# Patient Record
Sex: Male | Born: 1960
Health system: Southern US, Community
[De-identification: ages and names within clinical notes are randomized; demographics above are authoritative.]

## PROBLEM LIST (undated history)

## (undated) DIAGNOSIS — K219 Gastro-esophageal reflux disease without esophagitis: Secondary | ICD-10-CM

## (undated) DIAGNOSIS — I499 Cardiac arrhythmia, unspecified: Secondary | ICD-10-CM

## (undated) DIAGNOSIS — Z0282 Encounter for adoption services: Secondary | ICD-10-CM

## (undated) DIAGNOSIS — I480 Paroxysmal atrial fibrillation: Secondary | ICD-10-CM

## (undated) DIAGNOSIS — F329 Major depressive disorder, single episode, unspecified: Secondary | ICD-10-CM

## (undated) DIAGNOSIS — I519 Heart disease, unspecified: Secondary | ICD-10-CM

## (undated) DIAGNOSIS — F32A Depression, unspecified: Secondary | ICD-10-CM

## (undated) DIAGNOSIS — F419 Anxiety disorder, unspecified: Secondary | ICD-10-CM

---

## 2006-01-06 ENCOUNTER — Encounter: Admission: RE | Admit: 2006-01-06 | Discharge: 2006-01-06 | Payer: Self-pay | Admitting: Otolaryngology

## 2012-10-23 DIAGNOSIS — I499 Cardiac arrhythmia, unspecified: Secondary | ICD-10-CM

## 2012-10-23 HISTORY — DX: Cardiac arrhythmia, unspecified: I49.9

## 2012-11-01 ENCOUNTER — Encounter (HOSPITAL_COMMUNITY): Payer: Self-pay | Admitting: Unknown Physician Specialty

## 2012-11-01 ENCOUNTER — Ambulatory Visit (INDEPENDENT_AMBULATORY_CARE_PROVIDER_SITE_OTHER): Payer: PRIVATE HEALTH INSURANCE | Admitting: Family Medicine

## 2012-11-01 ENCOUNTER — Inpatient Hospital Stay (HOSPITAL_COMMUNITY)
Admission: EM | Admit: 2012-11-01 | Discharge: 2012-11-05 | DRG: 308 | Disposition: A | Discharge status: Home / Self Care | Payer: No Typology Code available for payment source | Attending: Cardiology | Admitting: Cardiology

## 2012-11-01 ENCOUNTER — Emergency Department (HOSPITAL_COMMUNITY): Payer: No Typology Code available for payment source

## 2012-11-01 VITALS — BP 133/96 | HR 170 | Temp 98.2°F | Resp 20 | Ht 72.0 in | Wt 184.0 lb

## 2012-11-01 DIAGNOSIS — Q211 Atrial septal defect: Secondary | ICD-10-CM

## 2012-11-01 DIAGNOSIS — I509 Heart failure, unspecified: Secondary | ICD-10-CM | POA: Diagnosis not present

## 2012-11-01 DIAGNOSIS — I4891 Unspecified atrial fibrillation: Principal | ICD-10-CM | POA: Diagnosis present

## 2012-11-01 DIAGNOSIS — E7800 Pure hypercholesterolemia, unspecified: Secondary | ICD-10-CM | POA: Diagnosis present

## 2012-11-01 DIAGNOSIS — K219 Gastro-esophageal reflux disease without esophagitis: Secondary | ICD-10-CM | POA: Diagnosis present

## 2012-11-01 DIAGNOSIS — R079 Chest pain, unspecified: Secondary | ICD-10-CM

## 2012-11-01 DIAGNOSIS — J189 Pneumonia, unspecified organism: Secondary | ICD-10-CM | POA: Diagnosis present

## 2012-11-01 DIAGNOSIS — I10 Essential (primary) hypertension: Secondary | ICD-10-CM | POA: Diagnosis present

## 2012-11-01 DIAGNOSIS — I5020 Unspecified systolic (congestive) heart failure: Secondary | ICD-10-CM | POA: Diagnosis not present

## 2012-11-01 DIAGNOSIS — I059 Rheumatic mitral valve disease, unspecified: Secondary | ICD-10-CM | POA: Diagnosis present

## 2012-11-01 DIAGNOSIS — R000 Tachycardia, unspecified: Secondary | ICD-10-CM

## 2012-11-01 DIAGNOSIS — E860 Dehydration: Secondary | ICD-10-CM

## 2012-11-01 DIAGNOSIS — Q2111 Secundum atrial septal defect: Secondary | ICD-10-CM

## 2012-11-01 DIAGNOSIS — I259 Chronic ischemic heart disease, unspecified: Secondary | ICD-10-CM | POA: Diagnosis present

## 2012-11-01 HISTORY — DX: Depression, unspecified: F32.A

## 2012-11-01 HISTORY — DX: Gastro-esophageal reflux disease without esophagitis: K21.9

## 2012-11-01 HISTORY — DX: Anxiety disorder, unspecified: F41.9

## 2012-11-01 HISTORY — DX: Major depressive disorder, single episode, unspecified: F32.9

## 2012-11-01 HISTORY — DX: Encounter for adoption services: Z02.82

## 2012-11-01 LAB — CBC WITH DIFFERENTIAL/PLATELET
Basophils Absolute: 0 10*3/uL (ref 0.0–0.1)
Basophils Relative: 0 % (ref 0–1)
Basophils Relative: 0 % (ref 0–1)
Eosinophils Relative: 1 % (ref 0–5)
Hemoglobin: 15.3 g/dL (ref 13.0–17.0)
Lymphocytes Relative: 13 % (ref 12–46)
Lymphs Abs: 1.3 10*3/uL (ref 0.7–4.0)
Lymphs Abs: 1.9 10*3/uL (ref 0.7–4.0)
Monocytes Absolute: 0.7 10*3/uL (ref 0.1–1.0)
Monocytes Relative: 7 % (ref 3–12)
Monocytes Relative: 6 % (ref 3–12)
Neutro Abs: 8.8 10*3/uL — ABNORMAL HIGH (ref 1.7–7.7)
Neutrophils Relative %: 79 % — ABNORMAL HIGH (ref 43–77)
Neutrophils Relative %: 77 % (ref 43–77)
RBC: 4.48 MIL/uL (ref 4.22–5.81)
RBC: 4.64 MIL/uL (ref 4.22–5.81)
RDW: 13.1 % (ref 11.5–15.5)
WBC: 11.5 10*3/uL — ABNORMAL HIGH (ref 4.0–10.5)

## 2012-11-01 LAB — COMPREHENSIVE METABOLIC PANEL
ALT: 33 U/L (ref 0–53)
Albumin: 3.5 g/dL (ref 3.5–5.2)
Alkaline Phosphatase: 58 U/L (ref 39–117)
BUN: 21 mg/dL (ref 6–23)
CO2: 24 mEq/L (ref 19–32)
Creatinine, Ser: 1.1 mg/dL (ref 0.50–1.35)
GFR calc Af Amer: 87 mL/min — ABNORMAL LOW (ref >90)
GFR calc Af Amer: >90 mL/min (ref >90)
GFR calc non Af Amer: 75 mL/min — ABNORMAL LOW (ref >90)
Glucose, Bld: 145 mg/dL — ABNORMAL HIGH (ref 70–99)
Potassium: 4.6 mEq/L (ref 3.5–5.1)
Sodium: 140 mEq/L (ref 135–145)
Total Bilirubin: 0.7 mg/dL (ref 0.3–1.2)
Total Protein: 6.5 g/dL (ref 6.0–8.3)

## 2012-11-01 LAB — APTT: aPTT: 162 seconds — ABNORMAL HIGH (ref 24–37)

## 2012-11-01 LAB — TROPONIN I: Troponin I: <0.3 ng/mL (ref <0.30)

## 2012-11-01 LAB — PROTIME-INR: INR: 1.32 (ref 0.00–1.49)

## 2012-11-01 LAB — POCT I-STAT TROPONIN I: Troponin i, poc: 0 ng/mL (ref 0.00–0.08)

## 2012-11-01 MED ORDER — METOPROLOL TARTRATE 1 MG/ML IV SOLN
5.0000 mg | INTRAVENOUS | Status: DC | PRN
  Administered 2012-11-01: 5 mg via INTRAVENOUS

## 2012-11-01 MED ORDER — ASPIRIN EC 81 MG PO TBEC
81.0000 mg | DELAYED_RELEASE_TABLET | Freq: Every day | ORAL | Status: DC
  Administered 2012-11-02 – 2012-11-05 (×4): 81 mg via ORAL
  Filled 2012-11-01 (×4): qty 1

## 2012-11-01 MED ORDER — METOPROLOL TARTRATE 1 MG/ML IV SOLN
5.0000 mg | Freq: Once | INTRAVENOUS | Status: AC
  Administered 2012-11-01: 5 mg via INTRAVENOUS
  Filled 2012-11-01: qty 5

## 2012-11-01 MED ORDER — ONDANSETRON HCL 4 MG/2ML IJ SOLN: 4.0000 mg | Freq: Four times a day (QID) | INTRAMUSCULAR | Status: DC | PRN

## 2012-11-01 MED ORDER — METOPROLOL TARTRATE 50 MG PO TABS
50.0000 mg | ORAL_TABLET | Freq: Two times a day (BID) | ORAL | Status: DC
  Administered 2012-11-01 – 2012-11-05 (×8): 50 mg via ORAL
  Filled 2012-11-01 (×9): qty 1

## 2012-11-01 MED ORDER — HEPARIN BOLUS VIA INFUSION
4000.0000 [IU] | Freq: Once | INTRAVENOUS | Status: AC
  Administered 2012-11-01: 4000 [IU] via INTRAVENOUS

## 2012-11-01 MED ORDER — ASPIRIN 81 MG PO CHEW
81.0000 mg | CHEWABLE_TABLET | Freq: Every evening | ORAL | Status: DC
  Administered 2012-11-01: 81 mg via ORAL
  Filled 2012-11-01: qty 2

## 2012-11-01 MED ORDER — SODIUM CHLORIDE 0.9 % IV BOLUS (SEPSIS)
1000.0000 mL | Freq: Once | INTRAVENOUS | Status: AC
  Administered 2012-11-01: 1000 mL via INTRAVENOUS

## 2012-11-01 MED ORDER — ACETAMINOPHEN 325 MG PO TABS
650.0000 mg | ORAL_TABLET | ORAL | Status: DC | PRN
  Administered 2012-11-03: 650 mg via ORAL
  Filled 2012-11-01: qty 2

## 2012-11-01 MED ORDER — HEPARIN (PORCINE) IN NACL 100-0.45 UNIT/ML-% IJ SOLN
1100.0000 [IU]/h | INTRAMUSCULAR | Status: DC
  Administered 2012-11-01: 1350 [IU]/h via INTRAVENOUS
  Administered 2012-11-02 (×2): 1250 [IU]/h via INTRAVENOUS
  Administered 2012-11-03: 1100 [IU]/h via INTRAVENOUS
  Administered 2012-11-03: 1050 [IU]/h via INTRAVENOUS
  Filled 2012-11-01 (×6): qty 250

## 2012-11-01 MED ORDER — METOPROLOL TARTRATE 1 MG/ML IV SOLN
2.5000 mg | INTRAVENOUS | Status: DC | PRN
  Administered 2012-11-02 (×2): 2.5 mg via INTRAVENOUS
  Filled 2012-11-01: qty 5

## 2012-11-01 MED ORDER — ATORVASTATIN CALCIUM 80 MG PO TABS
80.0000 mg | ORAL_TABLET | Freq: Every day | ORAL | Status: DC
  Administered 2012-11-02 – 2012-11-05 (×4): 80 mg via ORAL
  Filled 2012-11-01 (×4): qty 1

## 2012-11-01 MED ORDER — LEVOFLOXACIN IN D5W 500 MG/100ML IV SOLN
500.0000 mg | INTRAVENOUS | Status: DC
  Filled 2012-11-01: qty 100

## 2012-11-01 MED ORDER — PANTOPRAZOLE SODIUM 40 MG PO TBEC
40.0000 mg | DELAYED_RELEASE_TABLET | Freq: Every day | ORAL | Status: DC
  Administered 2012-11-03 – 2012-11-05 (×3): 40 mg via ORAL
  Filled 2012-11-01 (×3): qty 1

## 2012-11-01 MED ORDER — NITROGLYCERIN 0.4 MG SL SUBL: 0.4000 mg | SUBLINGUAL_TABLET | SUBLINGUAL | Status: DC | PRN

## 2012-11-01 MED ORDER — DILTIAZEM LOAD VIA INFUSION
20.0000 mg | Freq: Once | INTRAVENOUS | Status: AC
  Administered 2012-11-01: 20 mg via INTRAVENOUS
  Filled 2012-11-01: qty 20

## 2012-11-01 MED ORDER — DILTIAZEM HCL 100 MG IV SOLR
5.0000 mg/h | INTRAVENOUS | Status: DC
  Administered 2012-11-01: 5 mg/h via INTRAVENOUS
  Administered 2012-11-01 – 2012-11-02 (×2): 10 mg/h via INTRAVENOUS
  Filled 2012-11-01: qty 100

## 2012-11-01 MED ORDER — LEVOFLOXACIN IN D5W 750 MG/150ML IV SOLN
750.0000 mg | INTRAVENOUS | Status: DC
  Administered 2012-11-01 – 2012-11-03 (×3): 750 mg via INTRAVENOUS
  Filled 2012-11-01 (×4): qty 150

## 2012-11-01 MED ORDER — SODIUM CHLORIDE 0.9 % IV SOLN
INTRAVENOUS | Status: DC
  Administered 2012-11-01: 19:00:00 via INTRAVENOUS

## 2012-11-01 MED ORDER — METOPROLOL TARTRATE 25 MG PO TABS
25.0000 mg | ORAL_TABLET | Freq: Two times a day (BID) | ORAL | Status: DC
  Filled 2012-11-01: qty 1

## 2012-11-01 MED ORDER — METOPROLOL TARTRATE 1 MG/ML IV SOLN
INTRAVENOUS | Status: AC
  Filled 2012-11-01: qty 5

## 2012-11-01 MED ORDER — METOPROLOL TARTRATE 1 MG/ML IV SOLN
INTRAVENOUS | Status: AC
  Administered 2012-11-01: 5 mg
  Filled 2012-11-01: qty 5

## 2012-11-01 NOTE — H&P (Addendum)
[name] [name] is an 52 y.o. male.   Chief Complaint: Chest pain associated with palpitation/anxiety HPI: Patient is 52 year old male with past medical history significant for GERD came to the ER complaining of retrosternal chest pain associated with palpitation/anxiety tired feeling and weak off and on for last 10 days. Patient states he is under a lot of stress lately due to his house closing. Patient denies any nausea vomiting diaphoresis denies any shortness of breath but complains of cough with yellowish phlegm off and on for last 1 week denies any fever or chills. Denies history of exertional chest pain. Denies any thyroid problem. Denies any history of rheumatic fever. Patient denies any exertional chest pain. Denies PND orthopnea leg swelling.  Past Medical History  Diagnosis Date  . GERD (gastroesophageal reflux disease)     History reviewed. No pertinent past surgical history.  History reviewed. No pertinent family history. Social History:  reports that he has never smoked. He does not have any smokeless tobacco history on file. He reports that  drinks alcohol. He reports that he uses illicit drugs (Marijuana).  Allergies: No Known Allergies   (Not in a hospital admission)  Results for orders placed during the hospital encounter of 11/01/12 (from the past 48 hour(s))  CBC WITH DIFFERENTIAL     Status: Abnormal   Collection Time    11/01/12  1:59 PM      Result Value Range   WBC 9.9  4.0 - 10.5 K/uL   RBC 4.48  4.22 - 5.81 MIL/uL   Hemoglobin 15.0  13.0 - 17.0 g/dL   HCT 16.1  09.6 - 04.5 %   MCV 92.0  78.0 - 100.0 fL   MCH 33.5  26.0 - 34.0 pg   MCHC 36.4 (*) 30.0 - 36.0 g/dL   RDW 40.9  81.1 - 91.4 %   Platelets 215  150 - 400 K/uL   Neutrophils Relative 79 (*) 43 - 77 %   Neutro Abs 7.8 (*) 1.7 - 7.7 K/uL   Lymphocytes Relative 13  12 - 46 %   Lymphs Abs 1.3  0.7 - 4.0 K/uL   Monocytes Relative 7  3 - 12 %   Monocytes Absolute 0.7  0.1 - 1.0 K/uL   Eosinophils  Relative 1  0 - 5 %   Eosinophils Absolute 0.1  0.0 - 0.7 K/uL   Basophils Relative 0  0 - 1 %   Basophils Absolute 0.0  0.0 - 0.1 K/uL  POCT I-STAT TROPONIN I     Status: None   Collection Time    11/01/12  2:15 PM      Result Value Range   Troponin i, poc 0.00  0.00 - 0.08 ng/mL   Comment 3            Comment: Due to the release kinetics of cTnI,     a negative result within the first hours     of the onset of symptoms does not rule out     myocardial infarction with certainty.     If myocardial infarction is still suspected,     repeat the test at appropriate intervals.   Dg Chest Port 1 View  11/01/2012  *RADIOLOGY REPORT*  Clinical Data: Atrial fibrillation  PORTABLE CHEST - 1 VIEW  Comparison: None.  Findings: Mild patchy right lower lobe opacity, suspicious for pneumonia.  Mild patchy left basilar opacity, atelectasis versus pneumonia. No pleural effusion or pneumothorax.  The heart is normal in size.  IMPRESSION: Mild patchy right lower lobe opacity, suspicious for pneumonia.  Mild patchy left basilar opacity, atelectasis versus pneumonia.   Original Report Authenticated By: Charline Bills, M.D.     Review of Systems  Constitutional: Negative for fever and chills.  Respiratory: Positive for cough and sputum production. Negative for hemoptysis, shortness of breath and wheezing.   Cardiovascular: Positive for chest pain and palpitations. Negative for orthopnea, claudication, leg swelling and PND.  Gastrointestinal: Negative for nausea, vomiting and abdominal pain.  Genitourinary: Negative for dysuria.  Neurological: Negative for dizziness and headaches.    Blood pressure 127/112, pulse 155, temperature 98 F (36.7 C), temperature source Oral, resp. rate 29, SpO2 94.00%. Physical Exam  Constitutional: He is oriented to person, place, and time. He appears well-developed and well-nourished.  HENT:  Head: Normocephalic and atraumatic.  Eyes: Conjunctivae are normal. Pupils are  equal, round, and reactive to light. Left eye exhibits no discharge. No scleral icterus.  Neck: Normal range of motion. Neck supple. No JVD present. No tracheal deviation present. Thyromegaly present.  Cardiovascular:  Irregularly irregular tachycardic S1-S2 soft there is soft systolic murmur no S3 gallop no pericardial rub  Respiratory:  Decreased breath sound at bases with right basilar occasional rhonchi  GI: Soft. Bowel sounds are normal. He exhibits no distension. There is no tenderness. There is no rebound.  Musculoskeletal: He exhibits no edema and no tenderness.  Neurological: He is alert and oriented to person, place, and time.     Assessment/Plan Chest pain rule out MI New-onset A. fib with RVR Hypertension Probable right lower lobe pneumonia GERD Plan As per orders Discussed with patient at length regarding various options of treatment i.e. rate controlled versus rhythm control and TEE cardioversion this risk and benefits patient opted for medical management with rate control only now with chronic anticoagulation. Basha Krygier N 11/01/2012, 2:58 PM

## 2012-11-01 NOTE — ED Notes (Signed)
Paged Dr. Sharyn Lull for patients HR that is still not controlled.

## 2012-11-01 NOTE — Progress Notes (Signed)
[facility] [address] [place] [address] [phone]  Date:  11/01/2012   Name:  [name] [name]   DOB:  [birth year]   MRN:  [id number]  PCP:  No primary provider on file.    Chief Complaint: No chief complaint on file.   History of Present Illness:  [name] [name] is a 52 y.o. very pleasant male patient who presents with the following:  Here today with concern regaring a possible "panic attack."  For the last week or so he has noted racing heart beat, and some pressure and "tightness" in his left chest.  He has also noted that he gets out of breath more easily than usual and has noted some SOB.  He recently moved and is under some stress Non smoker, generally heathy, no past history of CAD or PE.    There is no problem list on file for this patient.   History reviewed. No pertinent past medical history.  History reviewed. No pertinent past surgical history.  History  Substance Use Topics  . Smoking status: Never Smoker   . Smokeless tobacco: Not on file  . Alcohol Use: No    History reviewed. No pertinent family history.  Allergies not on file  Medication list has been reviewed and updated.  No current outpatient prescriptions on file prior to visit.   No current facility-administered medications on file prior to visit.    Review of Systems:  As per HPI- otherwise negative.   Physical Examination: Filed Vitals:   11/01/12 1206  BP: 133/96  Pulse: 170  Temp: 98.2 F (36.8 C)  Resp: 20   Filed Vitals:   11/01/12 1206  Height: 6' (1.829 m)  Weight: 184 lb (83.462 kg)   Body mass index is 24.95 kg/(m^2). Ideal Body Weight: Weight in (lb) to have BMI = 25: 183.9  GEN: WDWN, NAD, Non-toxic, A & O x 3, appears slightly anxious HEENT: Atraumatic, Normocephalic. Neck supple. No masses, No LAD. Ears and Nose: No external deformity. CV: RRR- tachycardic, No M/G/R. No JVD. No thrill. No extra heart sounds. PULM: CTA B, no  wheezes, crackles, rhonchi. No retractions. No resp. distress. No accessory muscle use. ABD: S, NT, ND, +BS. No rebound. No HSM. EXTR: No c/c/e NEURO Normal gait.  PSYCH: Normally interactive. Conversant. Not depressed or anxious appearing.  Calm demeanor.   EKG: sinus tachycardia with rate up to 175.  No definite other abnormality noted.  Given asa 325, placed O2 via [place] 2L, placed IV for access, called 911 for transport  Assessment and Plan: Chest pain - Plan: EKG 12-Lead  Tachycardia  Unexplained significant tachycardia.  Consider PE, CAD or panic symptoms.  Transport to ED for further evaluation, appreciate their care of this nice gentleman.    Signed Abbe Amsterdam, MD

## 2012-11-01 NOTE — ED Provider Notes (Signed)
History     CSN: [id number]  Arrival date & time 11/01/12  1249   First MD Initiated Contact with Patient 11/01/12 1250      Chief Complaint  Patient presents with  . Atrial Fibrillation    (Consider location/radiation/quality/duration/timing/severity/associated sxs/prior treatment) HPI Patient presents with new palpitations, dyspnea, chest pressure.  This episode began earlier today without clear precipitant.  He notes that over the past 10 days he has had several similar episodes.  These episodes typically occur when the patient is lying supine, or feeling stressed.  This episode is similar to those episodes, though it is more pronounced.  There is mild associated chest pressure, anterior, nonradiating, sore.  There is mild associated dyspnea.  There is also mild associated fatigue and diaphoresis. No lightheadedness, no CP, no nausea, vomiting, no unilateral weakness. The patient has no prior cardiac evaluation, nor any significant medical issues for which takes any medication for. Since onset symptoms have been persistent, without clear alleviating or exacerbating factors. No past medical history on file.  No past surgical history on file.  No family history on file.  History  Substance Use Topics  . Smoking status: Never Smoker   . Smokeless tobacco: Not on file  . Alcohol Use: No      Review of Systems  All other systems reviewed and are negative.    Allergies  Review of patient's allergies indicates not on file.  Home Medications   Current Outpatient Rx  Name  Route  Sig  Dispense  Refill  . Multiple Vitamins-Minerals (MULTIVITAMIN WITH MINERALS) tablet   Oral   Take 1 tablet by mouth daily.         . vitamin C (ASCORBIC ACID) 500 MG tablet   Oral   Take 500 mg by mouth daily.           There were no vitals taken for this visit.  Physical Exam  Nursing note and vitals reviewed. Constitutional: He is oriented to person, place, and time. He  appears well-developed. No distress.  HENT:  Head: Normocephalic and atraumatic.  Eyes: Conjunctivae and EOM are normal.  Cardiovascular: Intact distal pulses.  An irregularly irregular rhythm present. Tachycardia present.   Pulmonary/Chest: Effort normal. No stridor. No respiratory distress.  Abdominal: He exhibits no distension.  Musculoskeletal: He exhibits no edema.  Neurological: He is alert and oriented to person, place, and time.  Skin: Skin is warm and dry.  Psychiatric: He has a normal mood and affect.   After arrival, I discussed the patient's case with the EMS providers. We reviewed their electrocardiogram - AFIB RVR ED Course  Procedures (including critical care time)  Labs Reviewed  CBC WITH DIFFERENTIAL  COMPREHENSIVE METABOLIC PANEL  TROPONIN I   No results found.   No diagnosis found.  Pulse ox 99% remainder normal Cardiac 171 irregular, tachycardic, abnormal    Update: I discussed the patient's case with our cardiologist on-call    Date: 11/01/2012  Rate: 185  Rhythm: atrial fibrillation  QRS Axis: normal  Intervals: afib  ST/T Wave abnormalities: nonspecific ST/T changes  Conduction Disutrbances:afib  Narrative Interpretation:   Old EKG Reviewed: none available ABNORMAL  Soon after the patient's arrival, after being placed on monitors she was started on a diltiazem drip.  Update: HR 151 afib   Update: Following initiation of the Cardizem, the patient's heart rate decreased. I confirmed results with him thus far, including x-ray results.  The patient now acknowledges that he has had recent  cough.  Given the cough, dyspnea, he was provided antibiotics for community-acquired pneumonia given the x-ray findings.  Update: Patient appears comfortable MDM  This patient presents with ongoing episodic chest pain, dyspnea.  On initial exam patient is in A. fib with RVR. Patient is not hypoxic, though he is diaphoretic.  X-ray demonstrates possible  pneumonia, and given his recent URI like symptoms he was also started on antibiotics for presumed community-acquired pneumonia.  Given the patient's A. fib, with RVR he was started on a diltiazem drip.  His heart rate subsequently decreased.  Given the acuity of his presentation, his unstable heart rhythm, he required admission for further evaluation and management.  CRITICAL CARE Performed by: Gerhard Munch   Total critical care time: 35 Critical care time was exclusive of separately billable procedures and treating other patients.  Critical care was necessary to treat or prevent imminent or life-threatening deterioration.  Critical care was time spent personally by me on the following activities: development of treatment plan with patient and/or surrogate as well as nursing, discussions with consultants, evaluation of patient's response to treatment, examination of patient, obtaining history from patient or surrogate, ordering and performing treatments and interventions, ordering and review of laboratory studies, ordering and review of radiographic studies, pulse oximetry and re-evaluation of patient's condition.         Gerhard Munch, MD 11/01/12 [phone]

## 2012-11-01 NOTE — ED Notes (Signed)
[facility] arrived via [facility]. Patient went to [facility] for shortness of breath and chest discomfort. Patient was found to be in Afib RVR with a rate of 180's. Patient is A/O.

## 2012-11-01 NOTE — Progress Notes (Signed)
ANTICOAGULATION CONSULT NOTE - Initial Consult  Pharmacy Consult for Heparin Indication: atrial fibrillation  No Known Allergies  Patient Measurements: Height: 6' (182.9 cm) Weight: 184 lb (83.462 kg) IBW/kg (Calculated) : 77.6  Vital Signs: Temp: 98 F (36.7 C) (04/10 1312) Temp src: Oral (04/10 1312) BP: 127/112 mmHg (04/10 1345) Pulse Rate: 155 (04/10 1345)  Labs:  Recent Labs  11/01/12 1359  HGB 15.0  HCT 41.2  PLT 215  CREATININE 1.10  TROPONINI <0.30    Estimated Creatinine Clearance: 85.2 ml/min (by C-G formula based on Cr of 1.1).   Medical History: Past Medical History  Diagnosis Date  . GERD (gastroesophageal reflux disease)     Medications:  Home: ASA 81, vit D, vit B12, MVI, Vit C  Assessment: 52 y.o. male presents with CP/palpitations. Found to have new onset afib with RVR. To begin heparin. CBC wnl at baseline.  Goal of Therapy:  Heparin level 0.3-0.7 units/ml Monitor platelets by anticoagulation protocol: Yes   Plan:  1. Heparin 4000 units IV bolus 2. Heparin gtt at 1350 units/hr 3. Will f/u 6 hr heparin level  4. Daily heparin level and CBC  Christoper Fabian, PharmD, BCPS Clinical pharmacist, pager [phone] 11/01/2012,4:49 PM

## 2012-11-01 NOTE — ED Notes (Signed)
Dr. Sharyn Lull paged

## 2012-11-02 ENCOUNTER — Encounter (HOSPITAL_COMMUNITY): Payer: Self-pay | Admitting: *Deleted

## 2012-11-02 ENCOUNTER — Encounter (HOSPITAL_COMMUNITY)
Admission: EM | Disposition: A | Discharge status: Home / Self Care | Payer: Self-pay | Source: Home / Self Care | Attending: Cardiology

## 2012-11-02 ENCOUNTER — Inpatient Hospital Stay (HOSPITAL_COMMUNITY): Payer: No Typology Code available for payment source | Attending: Cardiology

## 2012-11-02 ENCOUNTER — Inpatient Hospital Stay (HOSPITAL_COMMUNITY): Payer: No Typology Code available for payment source

## 2012-11-02 ENCOUNTER — Encounter (HOSPITAL_COMMUNITY): Payer: Self-pay | Admitting: Certified Registered Nurse Anesthetist

## 2012-11-02 ENCOUNTER — Inpatient Hospital Stay (HOSPITAL_COMMUNITY): Payer: No Typology Code available for payment source | Admitting: Certified Registered Nurse Anesthetist

## 2012-11-02 HISTORY — PX: CARDIOVERSION: SHX1299

## 2012-11-02 HISTORY — PX: TEE WITHOUT CARDIOVERSION: SHX5443

## 2012-11-02 LAB — LIPID PANEL
LDL Cholesterol: 67 mg/dL (ref 0–99)
Triglycerides: 107 mg/dL (ref <150)
VLDL: 21 mg/dL (ref 0–40)

## 2012-11-02 LAB — BASIC METABOLIC PANEL
BUN: 21 mg/dL (ref 6–23)
CO2: 23 mEq/L (ref 19–32)
Chloride: 108 mEq/L (ref 96–112)
Creatinine, Ser: 0.98 mg/dL (ref 0.50–1.35)
GFR calc Af Amer: >90 mL/min (ref >90)

## 2012-11-02 LAB — CBC
HCT: 40 % (ref 39.0–52.0)
MCHC: 35.5 g/dL (ref 30.0–36.0)
MCV: 92.8 fL (ref 78.0–100.0)
RDW: 13.3 % (ref 11.5–15.5)

## 2012-11-02 SURGERY — ECHOCARDIOGRAM, TRANSESOPHAGEAL
Anesthesia: Moderate Sedation

## 2012-11-02 MED ORDER — MIDAZOLAM HCL 10 MG/2ML IJ SOLN
INTRAMUSCULAR | Status: DC | PRN
  Administered 2012-11-02: 2 mg via INTRAVENOUS
  Administered 2012-11-02: 1 mg via INTRAVENOUS

## 2012-11-02 MED ORDER — DIGOXIN 0.25 MG/ML IJ SOLN
0.2500 mg | Freq: Every day | INTRAMUSCULAR | Status: AC
  Administered 2012-11-02: 0.25 mg via INTRAVENOUS
  Filled 2012-11-02: qty 1

## 2012-11-02 MED ORDER — FUROSEMIDE 10 MG/ML IJ SOLN
40.0000 mg | Freq: Once | INTRAMUSCULAR | Status: AC
  Administered 2012-11-02: 40 mg via INTRAVENOUS
  Filled 2012-11-02: qty 4

## 2012-11-02 MED ORDER — SPIRONOLACTONE 25 MG PO TABS
25.0000 mg | ORAL_TABLET | Freq: Every day | ORAL | Status: DC
  Administered 2012-11-02 – 2012-11-05 (×4): 25 mg via ORAL
  Filled 2012-11-02 (×4): qty 1

## 2012-11-02 MED ORDER — METOPROLOL TARTRATE 1 MG/ML IV SOLN
INTRAVENOUS | Status: AC
  Filled 2012-11-02: qty 5

## 2012-11-02 MED ORDER — METOPROLOL TARTRATE 1 MG/ML IV SOLN
5.0000 mg | Freq: Once | INTRAVENOUS | Status: AC
  Administered 2012-11-02: 5 mg via INTRAVENOUS

## 2012-11-02 MED ORDER — ZOLPIDEM TARTRATE 5 MG PO TABS
5.0000 mg | ORAL_TABLET | Freq: Every evening | ORAL | Status: DC | PRN
  Administered 2012-11-02: 5 mg via ORAL
  Filled 2012-11-02: qty 1

## 2012-11-02 MED ORDER — PROPOFOL 10 MG/ML IV BOLUS
INTRAVENOUS | Status: DC | PRN
  Administered 2012-11-02: 30 mg via INTRAVENOUS
  Administered 2012-11-02: 40 mg via INTRAVENOUS

## 2012-11-02 MED ORDER — LEVALBUTEROL HCL 1.25 MG/0.5ML IN NEBU
1.2500 mg | INHALATION_SOLUTION | Freq: Three times a day (TID) | RESPIRATORY_TRACT | Status: DC
  Administered 2012-11-03 (×2): 1.25 mg via RESPIRATORY_TRACT
  Filled 2012-11-02 (×5): qty 0.5

## 2012-11-02 MED ORDER — SODIUM CHLORIDE 0.9 % IV SOLN
INTRAVENOUS | Status: DC
  Administered 2012-11-02: 10:00:00 via INTRAVENOUS

## 2012-11-02 MED ORDER — LEVALBUTEROL HCL 0.63 MG/3ML IN NEBU
0.6300 mg | INHALATION_SOLUTION | Freq: Three times a day (TID) | RESPIRATORY_TRACT | Status: DC
  Administered 2012-11-02: 0.63 mg via RESPIRATORY_TRACT
  Filled 2012-11-02 (×3): qty 3

## 2012-11-02 MED ORDER — LIDOCAINE HCL (CARDIAC) 20 MG/ML IV SOLN
INTRAVENOUS | Status: DC | PRN
  Administered 2012-11-02: 50 mg via INTRAVENOUS

## 2012-11-02 MED ORDER — DILTIAZEM HCL 100 MG IV SOLR
5.0000 mg/h | INTRAVENOUS | Status: DC
  Filled 2012-11-02: qty 100

## 2012-11-02 MED ORDER — DIGOXIN 0.25 MG/ML IJ SOLN
0.2500 mg | INTRAMUSCULAR | Status: DC
  Filled 2012-11-02: qty 1

## 2012-11-02 MED ORDER — FENTANYL CITRATE 0.05 MG/ML IJ SOLN
INTRAMUSCULAR | Status: DC | PRN
  Administered 2012-11-02: 25 ug via INTRAVENOUS

## 2012-11-02 MED ORDER — FENTANYL CITRATE 0.05 MG/ML IJ SOLN
INTRAMUSCULAR | Status: AC
  Filled 2012-11-02: qty 2

## 2012-11-02 MED ORDER — BUTAMBEN-TETRACAINE-BENZOCAINE 2-2-14 % EX AERO
INHALATION_SPRAY | CUTANEOUS | Status: DC | PRN
  Administered 2012-11-02: 2 via TOPICAL

## 2012-11-02 MED ORDER — MIDAZOLAM HCL 5 MG/ML IJ SOLN
INTRAMUSCULAR | Status: AC
  Filled 2012-11-02: qty 2

## 2012-11-02 NOTE — Anesthesia Postprocedure Evaluation (Signed)
  Anesthesia Post-op Note  Patient: [name] [name]  Procedure(s) Performed: Procedure(s): TRANSESOPHAGEAL ECHOCARDIOGRAM (TEE) (N/A) CARDIOVERSION (N/A)  Patient Location: PICU and Short Stay  Anesthesia Type:General  Level of Consciousness: awake  Airway and Oxygen Therapy: Patient Spontanous Breathing  Post-op Pain: mild  Post-op Assessment: Post-op Vital signs reviewed  Post-op Vital Signs: Reviewed  Complications: No apparent anesthesia complications

## 2012-11-02 NOTE — Progress Notes (Signed)
  Echocardiogram Echocardiogram Transesophageal has been performed.  [name] [name] 11/02/2012, 3:55 PM

## 2012-11-02 NOTE — CV Procedure (Signed)
INDICATIONS:   The patient is 52 year old male with atrial fibrillation with rapid ventricular response.  PROCEDURE:  Informed consent was discussed including risks, benefits and alternatives for the procedure.  Risks include, but are not limited to, cough, sore throat, vomiting, nausea, somnolence, esophageal and stomach trauma or perforation, bleeding, low blood pressure, aspiration, pneumonia, infection, trauma to the teeth and death.    Patient was given sedation- 3 mg. Versed and 25 mcg. fentanyl.  The oropharynx was anesthetized with topical lidocaine.  The transesophageal probe was inserted in the esophagus and stomach and multiple views were obtained.  Agitated saline was used after the transesophageal probe was removed from the body.  The patient was kept under observation until the patient left the procedure room.  The patient left the procedure room in stable condition.   COMPLICATIONS:  There were no immediate complications.  FINDINGS:  1. LEFT VENTRICLE: The left ventricle is normal in structure and had mild to moderate systolic dysfunction.  Wall motion showed generalized hypokinesia. EF 40-45 %l.  No thrombus or masses seen in the left ventricle.  2. RIGHT VENTRICLE:  The right ventricle is normal in structure and function without any thrombus or masses.    3. LEFT ATRIUM:  The left atrium is enlarged without any thrombus or masses but showed smoke.  4. LEFT ATRIAL APPENDAGE:  The left atrial appendage is free of any thrombus or masses but showed smoke.  5. RIGHT ATRIUM:  The right atrium is free of any thrombus or masses. Prominent eustachian  valve  6. ATRIAL SEPTUM:  The atrial septum is normal without any ASD. Very small PFO with rare bubble crossing over on agitated saline injection.  7. MITRAL VALVE:  The mitral valve is normal in structure and function with mild to moderate regurgitation, No masses, stenosis or vegetations.  8. TRICUSPID VALVE:  The tricuspid valve is  normal in structure and function with trivial regurgitation. No masses, stenosis or vegetations.  9. AORTIC VALVE:  The aortic valve is normal in structure and function without regurgitation, masses, stenosis or vegetations.   10. PULMONIC VALVE:  The pulmonic valve is normal in structure and function without significant regurgitation, masses, stenosis or vegetations.  11. AORTIC ARCH, ASCENDING AND DESCENDING AORTA:  The aorta had minimal atherosclerosis in the ascending or descending aorta.  The aortic arch was normal.  IMPRESSION:   1. Mild to moderate LV systolic dysfunction. EF 40-45 % 2. Mild to moderate MR. 3. Very small PFO.  RECOMMENDATIONS:    Underwent cardioversion with restoration  Of normal sinus rhythm.Marland Kitchen

## 2012-11-02 NOTE — OR Nursing (Signed)
cardizem drip decreased to 5mg /hr. pcxr ordered.

## 2012-11-02 NOTE — Transfer of Care (Signed)
Immediate Anesthesia Transfer of Care Note  Patient: [name] [name]  Procedure(s) Performed: Procedure(s): TRANSESOPHAGEAL ECHOCARDIOGRAM (TEE) (N/A) CARDIOVERSION (N/A)  Patient Location: PACU  Anesthesia Type:General  Level of Consciousness: awake, alert  and oriented  Airway & Oxygen Therapy: Patient Spontanous Breathing, Patient connected to face mask oxygen and RT remains with patient to complete breathing treatment O2 sats 93 %  Post-op Assessment: Report given to PACU RN, Post -op Vital signs reviewed and stable and Patient moving all extremities  Post vital signs: Reviewed and stable  Complications: No apparent anesthesia complications

## 2012-11-02 NOTE — CV Procedure (Signed)
PRE-OP DIAGNOSIS:  Atrial fibrillation with rapid ventricular response.  POST-OP DIAGNOSIS:  Atrial fibrillation to sinus rhythm.  OPERATOR:  Orpah Cobb, MD..     ANESTHESIA:  Sedation with 50 mg. Lidocaine and 70 mg. of propafol.  COMPLICATIONS:  None but xopenex treatment was given for low oxygen saturation due to COPD and pneumonia.  OPERATIVE TERM:  DC cardioversion.  The nature of the procedure, risks and alternatives were discussed with the patient who gave informed consent.  OPERATIVE TECHNIQUE:  The patient was sedated with 70 mg. Of propafol.  When the patient was no longer responsive to quiet voice, DC cardioversion was performed with 120, 150 and 200 J biphasically and synchronously.  Patient converted to sinus rhythm on 3rd. shock.  The patient was then monitored until fully alert and left the procedure area in stable condition.  IMPRESSION:  Successful DC cardioversion.

## 2012-11-02 NOTE — Anesthesia Preprocedure Evaluation (Signed)
Anesthesia Evaluation  Patient identified by MRN, date of birth, ID band Patient awake    Reviewed: Allergy & Precautions, H&P , NPO status , Patient's Chart, lab work & pertinent test results  History of Anesthesia Complications Negative for: history of anesthetic complications  Airway Mallampati: II TM Distance: >3 FB Neck ROM: Full    Dental  (+) Teeth Intact and Dental Advisory Given   Pulmonary neg pulmonary ROS,    Pulmonary exam normal       Cardiovascular + dysrhythmias Atrial Fibrillation Rhythm:Irregular Rate:Normal     Neuro/Psych Anxiety Depression    GI/Hepatic Neg liver ROS, GERD-  Medicated and Controlled,  Endo/Other  negative endocrine ROS  Renal/GU negative Renal ROS     Musculoskeletal negative musculoskeletal ROS (+)   Abdominal Normal abdominal exam  (+)   Peds  Hematology negative hematology ROS (+)   Anesthesia Other Findings   Reproductive/Obstetrics                           Anesthesia Physical Anesthesia Plan  ASA: II  Anesthesia Plan: General   Post-op Pain Management:    Induction: Intravenous  Airway Management Planned: Mask  Additional Equipment:   Intra-op Plan:   Post-operative Plan:   Informed Consent: I have reviewed the patients History and Physical, chart, labs and discussed the procedure including the risks, benefits and alternatives for the proposed anesthesia with the patient or authorized representative who has indicated his/her understanding and acceptance.   Dental advisory given  Plan Discussed with: Anesthesiologist and Surgeon  Anesthesia Plan Comments:         Anesthesia Quick Evaluation

## 2012-11-02 NOTE — Progress Notes (Signed)
Subjective:  Patient denies any chest pain remains in atrial fibrillation with moderate ventricular response despite receiving multiple doses of IV beta blockers and Cardizem. Discussed again with patient regarding TEE cardioversion his risk and benefits and now agrees for it. Will hold off the stress test for now will reschedule it as outpatient as appropriate  Objective:  Vital Signs in the last 24 hours: Temp:  [97.6 F (36.4 C)-98.8 F (37.1 C)] 97.6 F (36.4 C) (04/11 0642) Pulse Rate:  [55-170] 111 (04/11 0642) Resp:  [14-29] 22 (04/11 0642) BP: (101-153)/(70-112) 112/80 mmHg (04/11 0642) SpO2:  [90 %-99 %] 92 % (04/11 0642) Weight:  [83.462 kg (184 lb)-84.55 kg (186 lb 6.4 oz)] 84.55 kg (186 lb 6.4 oz) (04/10 1900)  Intake/Output from previous day: 04/10 0701 - 04/11 0700 In: -  Out: 400 [Urine:400] Intake/Output from this shift:    Physical Exam: Neck: no adenopathy, no carotid bruit, no JVD and supple, symmetrical, trachea midline Lungs: clear to auscultation bilaterally Heart: irregularly irregular rhythm, S1, S2 normal and Soft systolic murmur noted no S3 gallop Abdomen: soft, non-tender; bowel sounds normal; no masses,  no organomegaly Extremities: extremities normal, atraumatic, no cyanosis or edema  Lab Results:  Recent Labs  11/01/12 1910 11/02/12 0440  WBC 11.5* 10.1  HGB 15.3 14.2  PLT 243 217    Recent Labs  11/01/12 1910 11/02/12 0440  NA 140 137  K 4.6 4.4  CL 108 108  CO2 21 23  GLUCOSE 145* 107*  BUN 20 21  CREATININE 1.03 0.98    Recent Labs  11/01/12 1910 11/02/12 0440  TROPONINI <0.30 <0.30   Hepatic Function Panel  Recent Labs  11/01/12 1910  PROT 6.5  ALBUMIN 3.5  AST 26  ALT 33  ALKPHOS 58  BILITOT 0.7    Recent Labs  11/02/12 0440  CHOL 125   No results found for this basename: PROTIME,  in the last 72 hours  Imaging: Imaging results have been reviewed and Dg Chest Port 1 View  11/01/2012  *RADIOLOGY REPORT*   Clinical Data: Atrial fibrillation  PORTABLE CHEST - 1 VIEW  Comparison: None.  Findings: Mild patchy right lower lobe opacity, suspicious for pneumonia.  Mild patchy left basilar opacity, atelectasis versus pneumonia. No pleural effusion or pneumothorax.  The heart is normal in size.  IMPRESSION: Mild patchy right lower lobe opacity, suspicious for pneumonia.  Mild patchy left basilar opacity, atelectasis versus pneumonia.   Original Report Authenticated By: Charline Bills, M.D.     Cardiac Studies:  Assessment/Plan:  Status post chest pain MI ruled out New-onset A. fib with RVR  Hypertension  Probable right lower lobe pneumonia  GERD Plan DC'd nuclear stress test Scheduled for TEE cardioversion today Dr. Algie Coffer on-call for weekend  LOS: 1 day    [facility] N 11/02/2012, 8:15 AM

## 2012-11-02 NOTE — Preoperative (Signed)
Beta Blockers   Reason not to administer Beta Blockers:Not Applicable 

## 2012-11-02 NOTE — Progress Notes (Signed)
ANTICOAGULATION CONSULT NOTE - Follow Up Consult  Pharmacy Consult for Heparin Indication: new onset atrial fibrillation  No Known Allergies  Patient Measurements: Height: 6' (182.9 cm) Weight: 186 lb 6.4 oz (84.55 kg) IBW/kg (Calculated) : 77.6 Heparin Dosing Weight: 84.6 kg  Vital Signs: Temp: 98.1 F (36.7 C) (04/11 1432) Temp src: Oral (04/11 1432) BP: 109/70 mmHg (04/11 1432) Pulse Rate: 68 (04/11 1432)  Labs:  Recent Labs  11/01/12 0001  11/01/12 1359 11/01/12 1910 11/02/12 0440 11/02/12 0840 11/02/12 1529  HGB  --   < > 15.0 15.3 14.2  --   --   HCT  --   --  41.2 42.7 40.0  --   --   PLT  --   --  215 243 217  --   --   APTT  --   --   --  162*  --   --   --   LABPROT  --   --   --  16.1*  --   --   --   INR  --   --   --  1.32  --   --   --   HEPARINUNFRC 0.74*  --   --   --   --  0.80* 0.49  CREATININE  --   --  1.10 1.03 0.98  --   --   TROPONINI <0.30  --  <0.30 <0.30 <0.30  --   --   < > = values in this interval not displayed.  Estimated Creatinine Clearance: 95.7 ml/min (by C-G formula based on Cr of 0.98).   Medications:  Heparin drip at 1050 units/hr  Assessment: 52 y/o male on heparin for new onset Afib. Heparin level is therapeutic. No bleeding noted, CBC stable.  Goal of Therapy:  Heparin level 0.3-0.7 units/ml Monitor platelets by anticoagulation protocol: Yes   Plan:  -Continue heparin drip at 1050 units/hr -Daily heparin level and CBC while on heparin -Monitor for signs/symptoms of bleeding  Celedonio Miyamoto, PharmD, BCPS Clinical Pharmacist Pager [phone]   11/02/2012 5:18 PM

## 2012-11-02 NOTE — Progress Notes (Signed)
ANTICOAGULATION CONSULT NOTE - Follow Up Consult  Pharmacy Consult for Heparin Indication: new onset atrial fibrillation  No Known Allergies  Patient Measurements: Height: 6' (182.9 cm) Weight: 186 lb 6.4 oz (84.55 kg) IBW/kg (Calculated) : 77.6 Heparin Dosing Weight: 84.6 kg  Vital Signs: Temp: 97.6 F (36.4 C) (04/11 0642) Temp src: Oral (04/11 0642) BP: 112/80 mmHg (04/11 0934) Pulse Rate: 139 (04/11 0934)  Labs:  Recent Labs  11/01/12 0001  11/01/12 1359 11/01/12 1910 11/02/12 0440 11/02/12 0840  HGB  --   < > 15.0 15.3 14.2  --   HCT  --   --  41.2 42.7 40.0  --   PLT  --   --  215 243 217  --   APTT  --   --   --  162*  --   --   LABPROT  --   --   --  16.1*  --   --   INR  --   --   --  1.32  --   --   HEPARINUNFRC 0.74*  --   --   --   --  0.80*  CREATININE  --   --  1.10 1.03 0.98  --   TROPONINI <0.30  --  <0.30 <0.30 <0.30  --   < > = values in this interval not displayed.  Estimated Creatinine Clearance: 95.7 ml/min (by C-G formula based on Cr of 0.98).   Medications:  Heparin drip at 1250 units/hr  Assessment: 52 y/o male on heparin for new onset Afib. Heparin level is above goal. No bleeding noted, CBC stable.  Goal of Therapy:  Heparin level 0.3-0.7 units/ml Monitor platelets by anticoagulation protocol: Yes   Plan:  -Decrease heparin drip to 1050 units/hr -Heparin level 6 hours after rate change -Daily heparin level and CBC while on heparin -Monitor for signs/symptoms of bleeding  [facility], [address].D., BCPS Clinical Pharmacist Pager: [phone] 11/02/2012 10:07 AM

## 2012-11-02 NOTE — Progress Notes (Signed)
ANTICOAGULATION CONSULT NOTE - Follow Up Consult  Pharmacy Consult for heparin Indication: atrial fibrillation  Labs:  Recent Labs  11/01/12 0001 11/01/12 1359 11/01/12 1910  HGB  --  15.0 15.3  HCT  --  41.2 42.7  PLT  --  215 243  APTT  --   --  162*  LABPROT  --   --  16.1*  INR  --   --  1.32  HEPARINUNFRC 0.74*  --   --   CREATININE  --  1.10 1.03  TROPONINI <0.30 <0.30 <0.30    Assessment: 51yo male slightly supratherapeutic on heparin with initial dosing for Afib (note that lab falsely entered as 4/10 at 0001 but was drawn on 4/11 at 0001).  Goal of Therapy:  Heparin level 0.3-0.7 units/ml   Plan:  Will decrease heparin gtt slightly to 1250 units/hr and check level in 6hr.  Vernard Gambles, PharmD, BCPS  11/02/2012,2:15 AM

## 2012-11-02 NOTE — Progress Notes (Signed)
Pt came back from endo and stated he "feels so wiped out." Sats in upper 80s to 91% on 5l [place]. Resp rate 25/min, lungs clear throughout, bp 108/70. He stated he felt better before he went to the procedure. He denies feeling short of breath but pt appears very weak. While moving him over to the stretcher, pt desated to 70%. I was on the phone with Dr. Sharyn Lull and let him know the above information. New orders received. Pts sats still maintaining in upper 80s to low 90s on 5l [place]. Incentive spirometer given and with that, sats up to 94%on 5l . Will closely monitor

## 2012-11-02 NOTE — Anesthesia Postprocedure Evaluation (Signed)
  Anesthesia Post-op Note  Patient: [name] [name]  Procedure(s) Performed: Procedure(s): TRANSESOPHAGEAL ECHOCARDIOGRAM (TEE) (N/A) CARDIOVERSION (N/A)  Patient Location: Endoscopy Unit  Anesthesia Type:General  Level of Consciousness: awake, alert  and oriented  Airway and Oxygen Therapy: Patient Spontanous Breathing and Patient connected to nasal cannula oxygen  Post-op Pain: none  Post-op Assessment: Post-op Vital signs reviewed, Patient's Cardiovascular Status Stable, Respiratory Function Stable and Patent Airway  Post-op Vital Signs: Reviewed and stable  Complications: No apparent anesthesia complications

## 2012-11-03 ENCOUNTER — Inpatient Hospital Stay (HOSPITAL_COMMUNITY): Payer: No Typology Code available for payment source

## 2012-11-03 LAB — CBC
Hemoglobin: 14.9 g/dL (ref 13.0–17.0)
MCH: 32.8 pg (ref 26.0–34.0)
MCV: 91.9 fL (ref 78.0–100.0)
RBC: 4.54 MIL/uL (ref 4.22–5.81)
WBC: 14.4 10*3/uL — ABNORMAL HIGH (ref 4.0–10.5)

## 2012-11-03 LAB — HEPARIN LEVEL (UNFRACTIONATED): Heparin Unfractionated: 0.31 IU/mL (ref 0.30–0.70)

## 2012-11-03 MED ORDER — DEXTROSE 5 % IV SOLN
500.0000 mg | INTRAVENOUS | Status: DC
  Administered 2012-11-03: 500 mg via INTRAVENOUS
  Filled 2012-11-03 (×2): qty 500

## 2012-11-03 MED ORDER — LEVALBUTEROL HCL 1.25 MG/0.5ML IN NEBU
1.2500 mg | INHALATION_SOLUTION | Freq: Four times a day (QID) | RESPIRATORY_TRACT | Status: DC | PRN
  Filled 2012-11-03: qty 0.5

## 2012-11-03 MED ORDER — OFF THE BEAT BOOK
Freq: Once | Status: AC
  Administered 2012-11-03: 16:00:00
  Filled 2012-11-03: qty 1

## 2012-11-03 MED ORDER — MENTHOL 3 MG MT LOZG
1.0000 | LOZENGE | OROMUCOSAL | Status: DC | PRN
  Filled 2012-11-03: qty 9

## 2012-11-03 NOTE — Progress Notes (Signed)
ANTICOAGULATION CONSULT NOTE - Follow Up Consult  Pharmacy Consult for Heparin Indication: new onset atrial fibrillation  No Known Allergies  Patient Measurements: Height: 6' (182.9 cm) Weight: 186 lb 6.4 oz (84.55 kg) IBW/kg (Calculated) : 77.6 Heparin Dosing Weight: 84.6 kg  Vital Signs: Temp: 97.9 F (36.6 C) (04/12 0602) Temp src: Oral (04/12 0602) BP: 125/81 mmHg (04/12 0602) Pulse Rate: 70 (04/12 0602)  Labs:  Recent Labs  11/01/12 1359 11/01/12 1910 11/02/12 0440 11/02/12 0840 11/02/12 1529 11/03/12 0510  HGB 15.0 15.3 14.2  --   --  14.9  HCT 41.2 42.7 40.0  --   --  41.7  PLT 215 243 217  --   --  200  APTT  --  162*  --   --   --   --   LABPROT  --  16.1*  --   --   --   --   INR  --  1.32  --   --   --   --   HEPARINUNFRC  --   --   --  0.80* 0.49 0.31  CREATININE 1.10 1.03 0.98  --   --   --   TROPONINI <0.30 <0.30 <0.30  --   --   --     Estimated Creatinine Clearance: 95.7 ml/min (by C-G formula based on Cr of 0.98).   Medications:  Heparin drip at 1050 units/hr  Assessment: 52 y/o male on heparin for new onset Afib. Heparin level is therapeutic on the low end of goal. No bleeding noted, CBC stable.  Goal of Therapy:  Heparin level 0.3-0.7 units/ml Monitor platelets by anticoagulation protocol: Yes   Plan:  -Increase heparin drip to 1100 units/hr -Daily heparin level and CBC while on heparin -Monitor for signs/symptoms of bleeding -Cardiology: Unsure why spironolactone was added yesterday with ho hx CHF or hypokalemia. Consider d/c spironolactone and add ACE-I if CHF suspected  [facility], [address].D., BCPS Clinical Pharmacist Pager: [phone] 11/03/2012 8:44 AM

## 2012-11-03 NOTE — Progress Notes (Signed)
Subjective:  No chest pain. Improving shortness of breath. RR-18. Remains in sinus rhythm. Elevated WBC count.  Objective:  Vital Signs in the last 24 hours: Temp:  [97.7 F (36.5 C)-98.3 F (36.8 C)] 97.9 F (36.6 C) (04/12 0602) Pulse Rate:  [68-139] 70 (04/12 0602) Cardiac Rhythm:  [-] Normal sinus rhythm (04/11 1940) Resp:  [11-33] 18 (04/12 0602) BP: (97-169)/(49-97) 125/81 mmHg (04/12 0602) SpO2:  [86 %-98 %] 98 % (04/12 0856)  Physical Exam: BP Readings from Last 1 Encounters:  11/03/12 125/81     Wt Readings from Last 1 Encounters:  11/01/12 84.55 kg (186 lb 6.4 oz)    Weight change:   HEENT: [place]/AT, Eyes-Hazel, PERL, EOMI, Conjunctiva-Pink, Sclera-Non-icteric Neck: No JVD, No bruit, Trachea midline. Lungs:  Clear, Bilateral. Cardiac:  Regular rhythm, normal S1 and S2, no S3.  Abdomen:  Soft, non-tender. Extremities:  No edema present. No cyanosis. No clubbing. CNS: AxOx3, Cranial nerves grossly intact, moves all 4 extremities. Right handed. Skin: Warm and dry.   Intake/Output from previous day: 04/11 0701 - 04/12 0700 In: 1156 [P.O.:360; I.V.:376; IV Piggyback:300] Out: 3225 [Urine:3225]    Lab Results: BMET    Component Value Date/Time   NA 137 11/02/2012 0440   K 4.4 11/02/2012 0440   CL 108 11/02/2012 0440   CO2 23 11/02/2012 0440   GLUCOSE 107* 11/02/2012 0440   BUN 21 11/02/2012 0440   CREATININE 0.98 11/02/2012 0440   CALCIUM 8.4 11/02/2012 0440   GFRNONAA >90 11/02/2012 0440   GFRAA >90 11/02/2012 0440   CBC    Component Value Date/Time   WBC 14.4* 11/03/2012 0510   RBC 4.54 11/03/2012 0510   HGB 14.9 11/03/2012 0510   HCT 41.7 11/03/2012 0510   PLT 200 11/03/2012 0510   MCV 91.9 11/03/2012 0510   MCH 32.8 11/03/2012 0510   MCHC 35.7 11/03/2012 0510   RDW 13.1 11/03/2012 0510   LYMPHSABS 1.9 11/01/2012 1910   MONOABS 0.7 11/01/2012 1910   EOSABS 0.1 11/01/2012 1910   BASOSABS 0.1 11/01/2012 1910   CARDIAC ENZYMES Lab Results  Component Value Date   TROPONINI <0.30 11/02/2012    Scheduled Meds: . aspirin EC  81 mg Oral Daily  . atorvastatin  80 mg Oral q1800  . levalbuterol  1.25 mg Nebulization TID  . levofloxacin (LEVAQUIN) IV  750 mg Intravenous Q24H  . metoprolol tartrate  50 mg Oral BID  . pantoprazole  40 mg Oral Q0600  . spironolactone  25 mg Oral Daily   Continuous Infusions: . heparin 1,050 Units/hr (11/03/12 0522)   PRN Meds:.acetaminophen, menthol-cetylpyridinium, metoprolol, metoprolol, nitroGLYCERIN, ondansetron (ZOFRAN) IV, zolpidem  EKG-SR, possible anterolateral ischemia.  Assessment/Plan: New-onset A. fib with RVR, now in SR, post cardioversion. Possible antero-lateral ischemia Hypertension  Bilateral lower lobe pneumonia  GERD  Continue medical treatment. Add azithromycin   LOS: 2 days    Orpah Cobb  MD  11/03/2012, 9:05 AM

## 2012-11-04 LAB — CBC
HCT: 41.5 % (ref 39.0–52.0)
Hemoglobin: 14.8 g/dL (ref 13.0–17.0)
MCHC: 35.7 g/dL (ref 30.0–36.0)
RBC: 4.56 MIL/uL (ref 4.22–5.81)

## 2012-11-04 LAB — HEPARIN LEVEL (UNFRACTIONATED): Heparin Unfractionated: 0.38 IU/mL (ref 0.30–0.70)

## 2012-11-04 MED ORDER — LEVOFLOXACIN 750 MG PO TABS
750.0000 mg | ORAL_TABLET | Freq: Every day | ORAL | Status: DC
  Administered 2012-11-04 – 2012-11-05 (×2): 750 mg via ORAL
  Filled 2012-11-04 (×2): qty 1

## 2012-11-04 MED ORDER — AZITHROMYCIN 500 MG PO TABS
500.0000 mg | ORAL_TABLET | Freq: Every day | ORAL | Status: AC
  Administered 2012-11-04 – 2012-11-05 (×2): 500 mg via ORAL
  Filled 2012-11-04 (×2): qty 1

## 2012-11-04 MED ORDER — RIVAROXABAN 15 MG PO TABS: 15.0000 mg | ORAL_TABLET | Freq: Every day | ORAL | Status: DC

## 2012-11-04 MED ORDER — RIVAROXABAN 20 MG PO TABS
20.0000 mg | ORAL_TABLET | Freq: Once | ORAL | Status: AC
  Administered 2012-11-04: 20 mg via ORAL
  Filled 2012-11-04: qty 1

## 2012-11-04 MED ORDER — RIVAROXABAN 20 MG PO TABS
20.0000 mg | ORAL_TABLET | Freq: Every day | ORAL | Status: DC
Start: 2012-11-05 — End: 2012-11-05
  Filled 2012-11-04: qty 1

## 2012-11-04 NOTE — Progress Notes (Signed)
Subjective:  Feeling better. Less shortness of breath. Down to 2 L/min of oxygen. Sinus rhythm continues.  Objective:  Vital Signs in the last 24 hours: Temp:  [97.8 F (36.6 C)-98.3 F (36.8 C)] 98.2 F (36.8 C) (04/13 0428) Pulse Rate:  [69-82] 69 (04/13 0428) Cardiac Rhythm:  [-] Normal sinus rhythm (04/12 2010) Resp:  [18-20] 20 (04/13 0428) BP: (121-127)/(76-87) 122/82 mmHg (04/13 0428) SpO2:  [96 %-99 %] 99 % (04/13 0428)  Physical Exam: BP Readings from Last 1 Encounters:  11/04/12 122/82     Wt Readings from Last 1 Encounters:  11/01/12 84.55 kg (186 lb 6.4 oz)    Weight change:   HEENT: [place]/AT, Eyes-Hazel, PERL, EOMI, wears glasses, Conjunctiva-Pink, Sclera-Non-icteric Neck: No JVD, No bruit, Trachea midline. Lungs:  Clear, Bilateral. Cardiac:  Regular rhythm, normal S1 and S2, no S3.  Abdomen:  Soft, non-tender. Extremities:  No edema present. No cyanosis. No clubbing. CNS: AxOx3, Cranial nerves grossly intact, moves all 4 extremities. Right handed. Skin: Warm and dry.   Intake/Output from previous day: 04/12 0701 - 04/13 0700 In: 840 [P.O.:840] Out: 2200 [Urine:2200]    Lab Results: BMET    Component Value Date/Time   NA 137 11/02/2012 0440   K 4.4 11/02/2012 0440   CL 108 11/02/2012 0440   CO2 23 11/02/2012 0440   GLUCOSE 107* 11/02/2012 0440   BUN 21 11/02/2012 0440   CREATININE 0.98 11/02/2012 0440   CALCIUM 8.4 11/02/2012 0440   GFRNONAA >90 11/02/2012 0440   GFRAA >90 11/02/2012 0440   CBC    Component Value Date/Time   WBC 10.3 11/04/2012 0500   RBC 4.56 11/04/2012 0500   HGB 14.8 11/04/2012 0500   HCT 41.5 11/04/2012 0500   PLT 207 11/04/2012 0500   MCV 91.0 11/04/2012 0500   MCH 32.5 11/04/2012 0500   MCHC 35.7 11/04/2012 0500   RDW 13.0 11/04/2012 0500   LYMPHSABS 1.9 11/01/2012 1910   MONOABS 0.7 11/01/2012 1910   EOSABS 0.1 11/01/2012 1910   BASOSABS 0.1 11/01/2012 1910   CARDIAC ENZYMES Lab Results  Component Value Date   TROPONINI <0.30  11/02/2012    Scheduled Meds: . aspirin EC  81 mg Oral Daily  . atorvastatin  80 mg Oral q1800  . azithromycin  500 mg Oral Daily  . levofloxacin  750 mg Oral q1800  . metoprolol tartrate  50 mg Oral BID  . pantoprazole  40 mg Oral Q0600  . rivaroxaban  15 mg Oral Daily  . spironolactone  25 mg Oral Daily   Continuous Infusions:  PRN Meds:.acetaminophen, levalbuterol, menthol-cetylpyridinium, metoprolol, metoprolol, nitroGLYCERIN, ondansetron (ZOFRAN) IV, zolpidem  Assessment/Plan: New-onset A. fib with RVR, now in SR, post cardioversion.  Possible antero-lateral ischemia  Hypertension  Bilateral lower lobe pneumonia  GERD  Add Xarelto and DC heparin.    LOS: 3 days    Orpah Cobb  MD  11/04/2012, 10:34 AM

## 2012-11-04 NOTE — Progress Notes (Signed)
ANTICOAGULATION CONSULT NOTE - Follow Up Consult  Pharmacy Consult for Heparin Indication: new onset atrial fibrillation  No Known Allergies  Patient Measurements: Height: 6' (182.9 cm) Weight: 186 lb 6.4 oz (84.55 kg) IBW/kg (Calculated) : 77.6 Heparin Dosing Weight: 84.6 kg  Vital Signs: Temp: 98.2 F (36.8 C) (04/13 0428) Temp src: Oral (04/13 0428) BP: 122/82 mmHg (04/13 0428) Pulse Rate: 69 (04/13 0428)  Labs:  Recent Labs  11/01/12 1359 11/01/12 1910 11/02/12 0440  11/02/12 1529 11/03/12 0510 11/04/12 0500  HGB 15.0 15.3 14.2  --   --  14.9 14.8  HCT 41.2 42.7 40.0  --   --  41.7 41.5  PLT 215 243 217  --   --  200 207  APTT  --  162*  --   --   --   --   --   LABPROT  --  16.1*  --   --   --   --   --   INR  --  1.32  --   --   --   --   --   HEPARINUNFRC  --   --   --   < > 0.49 0.31 0.38  CREATININE 1.10 1.03 0.98  --   --   --   --   TROPONINI <0.30 <0.30 <0.30  --   --   --   --   < > = values in this interval not displayed.  Estimated Creatinine Clearance: 95.7 ml/min (by C-G formula based on Cr of 0.98).   Medications:  . heparin 1,100 Units/hr (11/03/12 1007)   Assessment: 52 y/o male on heparin for new onset Afib. Heparin level is therapeutic. No bleeding noted, CBC stable.  Goal of Therapy:  Heparin level 0.3-0.7 units/ml Monitor platelets by anticoagulation protocol: Yes   Plan:  -Continue heparin drip at 1100 units/hr -Daily heparin level and CBC while on heparin -Monitor for signs/symptoms of bleeding -Cardiology: Unsure why spironolactone was added 4/11 with no hx CHF or hypokalemia. Consider d/c spironolactone and add ACE-I if CHF suspected  [facility], [address].D., BCPS Clinical Pharmacist Pager: [phone] 11/04/2012 8:15 AM

## 2012-11-05 ENCOUNTER — Inpatient Hospital Stay (HOSPITAL_COMMUNITY): Payer: No Typology Code available for payment source

## 2012-11-05 ENCOUNTER — Encounter (HOSPITAL_COMMUNITY): Payer: Self-pay | Admitting: Cardiovascular Disease

## 2012-11-05 LAB — CBC
HCT: 43.5 % (ref 39.0–52.0)
Hemoglobin: 15.9 g/dL (ref 13.0–17.0)
MCV: 89.1 fL (ref 78.0–100.0)
RDW: 13 % (ref 11.5–15.5)
WBC: 9.5 10*3/uL (ref 4.0–10.5)

## 2012-11-05 MED ORDER — SPIRONOLACTONE 25 MG PO TABS: 12.5000 mg | ORAL_TABLET | Freq: Every day | ORAL | Status: DC

## 2012-11-05 MED ORDER — ATORVASTATIN CALCIUM 80 MG PO TABS: 40.0000 mg | ORAL_TABLET | Freq: Every day | ORAL | Status: DC

## 2012-11-05 MED ORDER — LEVOFLOXACIN 750 MG PO TABS: 750.0000 mg | ORAL_TABLET | Freq: Every day | ORAL | Status: DC

## 2012-11-05 MED ORDER — RAMIPRIL 2.5 MG PO CAPS: 2.5000 mg | ORAL_CAPSULE | Freq: Every day | ORAL | Status: DC

## 2012-11-05 MED ORDER — METOPROLOL TARTRATE 50 MG PO TABS: 50.0000 mg | ORAL_TABLET | Freq: Two times a day (BID) | ORAL | Status: DC

## 2012-11-05 MED ORDER — RIVAROXABAN 20 MG PO TABS: 20.0000 mg | ORAL_TABLET | Freq: Every day | ORAL | Status: DC

## 2012-11-05 NOTE — Progress Notes (Signed)
Pt 96-97% on RA while resting, no c/o SOB; ambulated pt in hallways and o2 sats remained 93% and above with no c/o SOB; pt resting comfortably in bed

## 2012-11-05 NOTE — Progress Notes (Signed)
D/c orders received;IVs removed with gauze on, pt remains in stable condition, pt meds and instructions reviewed and given to pt; pt d/c to home 

## 2012-11-05 NOTE — Discharge Summary (Signed)
  Discharge summary dictated on 11/05/2012 the dictation number is [phone]

## 2012-11-05 NOTE — Care Management Note (Signed)
    Page 1 of 1   11/05/2012     2:42:45 PM   CARE MANAGEMENT NOTE 11/05/2012  Patient:  [name] [name],[name] [name]   Account Number:  [id number]  Date Initiated:  11/05/2012  Documentation initiated by:  GRAVES-BIGELOW,Jonnathan Birman  Subjective/Objective Assessment:   Pt admitted with Afib with RVR. Plan for return home today on xarelto. CM did call [facility] to see what the co pay will be and pt did not have insurance on file.     Action/Plan:   Benefits check in process. Will make pt aware of cost once completed.   Anticipated DC Date:  11/05/2012   Anticipated DC Plan:  HOME/SELF CARE      DC Planning Services  CM consult      Choice offered to / List presented to:             Status of service:  Completed, signed off Medicare Important Message given?   (If response is "NO", the following Medicare IM given date fields will be blank) Date Medicare IM given:   Date Additional Medicare IM given:    Discharge Disposition:  HOME/SELF CARE  Per UR Regulation:  Reviewed for med. necessity/level of care/duration of stay  If discussed at Long Length of Stay Meetings, dates discussed:    Comments:

## 2012-11-06 NOTE — Discharge Summary (Signed)
NAMEFALON, FLINCHUM            ACCOUNT NO.:  [id number]  MEDICAL RECORD NO.:  [id number]  LOCATION:  [id number]                        FACILITY:  [facility]  PHYSICIAN:  Serine Kea N. Sharyn Lull, M.D. DATE OF BIRTH:  04/14/1961  DATE OF ADMISSION:  11/01/2012 DATE OF DISCHARGE:  11/05/2012                              DISCHARGE SUMMARY   ADMITTING DIAGNOSES: 1. Atypical chest pain, rule out myocardial infarction. 2. New onset atrial fibrillation with rapid ventricular response. 3. Hypertension. 4. Right lower lobe pneumonia. 5. Gastroesophageal reflux disease.  DISCHARGE DIAGNOSES: 1. Status post chest pain, myocardial infarction ruled out. 2. Status post atrial fibrillation with rapid ventricular response. 3. Status post transesophageal echocardiogram cardioversion. 4. Resolving bilateral pneumonia. 5. Resolving mild systolic heart failure secondary to volume     overload/tachycardia. 6. Hypertension. 7. Gastroesophageal reflux disease. 8. Hypercholesteremia.  DISCHARGE HOME MEDICATIONS:  Atorvastatin 40 mg 1 tab daily, levofloxacin 750 mg 1 tablet daily for 5 more days, metoprolol tartrate 50 mg twice daily, ramipril 2.5 mg 1 capsule daily, Xarelto 20 mg 1 tablet daily, Aldactone 25 mg half tab daily, aspirin 81 mg 1 tablet daily, multivitamin with minerals 1 tablet daily as before, vitamin B12, vitamin C, vitamin D as before.  DIET:  Low salt, low cholesterol.  ACTIVITY:  Increase activity slowly as tolerated.  FOLLOWUP:  Follow up with me in 1 week.  CONDITION AT DISCHARGE:  Stable.  BRIEF HISTORY AND HOSPITAL COURSE:  [name] is a 52 year old white male with past medical history significant for GERD.  He came to the ER complaining of retrosternal chest pain associated with palpitations/anxiety and tired feeling and associated with generalized weakness, off and on for last 10 days.  The patient states he is under lot of stress lately due to his house closing.  The patient  denies any nausea, vomiting, diaphoresis.  Denies any shortness of breath, but complains of cough with yellowish phlegm off and on for last 1 week. Denies any fever or chills.  Denies any history of exertional chest pain.  Denies any thyroid problems.  No history of rheumatic fever in the past.  Denies PND, orthopnea, or leg swelling.  PAST MEDICAL HISTORY:  As above.  PHYSICAL EXAMINATION:  VITAL SIGNS:  His blood pressure was 127/112, pulse was 155, he was in AFib with RVR, afebrile. HEENT:  Conjunctiva was pink. NECK:  Supple.  No JVD.  No bruit. LUNGS:  He has decreased breath sounds at bases with right basilar occasional rhonchi. CARDIOVASCULAR:  Irregularly irregular.  He was tachycardic.  S1-S2 was soft.  There was soft systolic murmur.  No S3 gallop or pericardial rub. ABDOMEN:  Soft.  Bowel sounds were present.  Nontender. EXTREMITIES:  There was no clubbing, cyanosis, or edema.  LABORATORY DATA:  His sodium was 141, potassium 3.6, BUN 21, creatinine 1.01.  Glucose was 111.  Four sets of troponin-I were negative. Hemoglobin was 15, hematocrit 41.2, white count of 9.9.  His TSH was normal at 2.26.  Chest x-ray showed mild patchy right lower lobe opacity suspicious for pneumonia.  Repeat chest x-ray showed worsening bibasilar airspace opacities with obstruction of the hemidiaphragms favoring lower lobe atelectasis, probably secondary to layering of pleural  effusion, particularly on the right.  Repeat chest x-ray today showed interval improvement and bilateral pleural effusion and bibasilar atelectasis. This could be due to resolving fluid overload.  EKG post cardioversion showed normal sinus rhythm with ST-T wave changes in inferolateral leads.  BRIEF HOSPITAL COURSE:  The patient was admitted to telemetry unit.  MI was ruled out by serial enzymes and EKG.  The patient was started on IV heparin and subsequently underwent TEE cardioversion without any complications.  Post  procedure, patient went into mild heart failure requiring IV Lasix and was started on Aldactone with improvement in his symptoms.  Patient was continued on Levaquin which was switched to p.o. The patient remains afebrile during the hospital stay.  Patient will be discharged home on above medications and will be followed up in my office in 1 week.     Eduardo Osier. Sharyn Lull, M.D.     [facility]  D:  11/05/2012  T:  11/06/2012  Job:  [id number]

## 2012-11-12 ENCOUNTER — Other Ambulatory Visit (HOSPITAL_COMMUNITY): Payer: Self-pay | Admitting: Cardiology

## 2012-11-12 DIAGNOSIS — R079 Chest pain, unspecified: Secondary | ICD-10-CM

## 2012-11-23 ENCOUNTER — Encounter (HOSPITAL_COMMUNITY)
Admission: RE | Admit: 2012-11-23 | Discharge: 2012-11-23 | Disposition: A | Discharge status: Home / Self Care | Payer: No Typology Code available for payment source | Source: Ambulatory Visit | Attending: Cardiology | Admitting: Cardiology

## 2012-11-23 ENCOUNTER — Other Ambulatory Visit: Payer: Self-pay

## 2012-11-23 DIAGNOSIS — R079 Chest pain, unspecified: Secondary | ICD-10-CM | POA: Insufficient documentation

## 2012-11-23 DIAGNOSIS — I10 Essential (primary) hypertension: Secondary | ICD-10-CM | POA: Insufficient documentation

## 2012-11-23 DIAGNOSIS — I4891 Unspecified atrial fibrillation: Secondary | ICD-10-CM | POA: Insufficient documentation

## 2012-11-23 MED ORDER — TECHNETIUM TC 99M SESTAMIBI - CARDIOLITE
10.0000 | Freq: Once | INTRAVENOUS | Status: AC | PRN
  Administered 2012-11-23: 10 via INTRAVENOUS

## 2012-11-23 MED ORDER — REGADENOSON 0.4 MG/5ML IV SOLN
INTRAVENOUS | Status: AC
  Administered 2012-11-23: 0.4 mg
  Filled 2012-11-23: qty 5

## 2012-11-23 MED ORDER — REGADENOSON 0.4 MG/5ML IV SOLN: 0.4000 mg | Freq: Once | INTRAVENOUS | Status: DC

## 2012-11-23 MED ORDER — TECHNETIUM TC 99M SESTAMIBI - CARDIOLITE
30.0000 | Freq: Once | INTRAVENOUS | Status: AC | PRN
  Administered 2012-11-23: 10:00:00 30 via INTRAVENOUS

## 2013-08-22 ENCOUNTER — Telehealth: Payer: Self-pay | Admitting: Cardiology

## 2013-08-22 NOTE — Telephone Encounter (Signed)
ROI faxed to Dr.Harwani at [phone]

## 2013-08-22 NOTE — Telephone Encounter (Signed)
Walk In pt Form " New Pt packet" gave to [facility]

## 2013-08-29 ENCOUNTER — Telehealth: Payer: Self-pay | Admitting: Cardiology

## 2013-08-29 NOTE — Telephone Encounter (Signed)
Records rec From Dr.Harwani office gave to [facility]

## 2013-09-09 ENCOUNTER — Encounter: Payer: Self-pay | Admitting: *Deleted

## 2013-09-13 ENCOUNTER — Encounter: Payer: Self-pay | Admitting: Cardiology

## 2013-09-13 ENCOUNTER — Ambulatory Visit (INDEPENDENT_AMBULATORY_CARE_PROVIDER_SITE_OTHER): Payer: No Typology Code available for payment source | Admitting: Cardiology

## 2013-09-13 VITALS — BP 138/84 | HR 57 | Ht 72.0 in | Wt 170.0 lb

## 2013-09-13 DIAGNOSIS — I4891 Unspecified atrial fibrillation: Secondary | ICD-10-CM

## 2013-09-13 DIAGNOSIS — R931 Abnormal findings on diagnostic imaging of heart and coronary circulation: Secondary | ICD-10-CM

## 2013-09-13 DIAGNOSIS — R9439 Abnormal result of other cardiovascular function study: Secondary | ICD-10-CM

## 2013-09-13 DIAGNOSIS — I480 Paroxysmal atrial fibrillation: Secondary | ICD-10-CM | POA: Insufficient documentation

## 2013-09-13 NOTE — Patient Instructions (Signed)
Please stop Lipitor (atrovastatin) and ASA. Continue all other medications as listed.  Your physician has requested that you have an echocardiogram in 6 months. Echocardiography is a painless test that uses sound waves to create images of your heart. It provides your doctor with information about the size and shape of your heart and how well your heart's chambers and valves are working. This procedure takes approximately one hour. There are no restrictions for this procedure.  Follow up in 6 months with Dr Antoine Poche.  You will receive a letter in the mail 2 months before you are due.  Please call us when you receive this letter to schedule your follow up appointment.

## 2013-09-13 NOTE — Progress Notes (Signed)
HPI The patient presents as a new patient. He was hospitalized in April of last year. He had an ammonia. He was found to be in atrial fibrillation with a rapid rate. During this evaluation he had an echo and a transesophageal echo as well as a stress perfusion study. He did undergo cardioversion. He was found to have an EF of about 45% with global hypokinesis. However, he did not have any high risk findings suggestive of ischemia. He was converted to sinus rhythm and seems to have maintained this. He has not noticed any of the rapid heart rate that he had previously. At the time of all of this he was quite short of breath than having difficulty sleeping and feeling fatigued. He was under stress. However, since then he's had no further problems. Evening to be active and can even ride a bicycle or do a little jogging without symptoms. The patient denies any new symptoms such as chest discomfort, neck or arm discomfort. There has been no new shortness of breath, PND or orthopnea. There have been no reported palpitations, presyncope or syncope.  No Known Allergies  Current Outpatient Prescriptions  Medication Sig Dispense Refill  . apixaban (ELIQUIS) 5 MG TABS tablet Take 5 mg by mouth 2 (two) times daily.      Marland Kitchen aspirin 81 MG chewable tablet Chew 81 mg by mouth daily.       Marland Kitchen atorvastatin (LIPITOR) 80 MG tablet Take 0.5 tablets (40 mg total) by mouth daily at 6 PM.  30 tablet  3  . Cholecalciferol (VITAMIN D PO) Take 2-4 tablets by mouth daily with breakfast.       . Cyanocobalamin (VITAMIN B-12 PO) Take 1 tablet by mouth daily with breakfast.      . metoprolol succinate (TOPROL-XL) 50 MG 24 hr tablet Take 50 mg by mouth daily. Take with or immediately following a meal.      . Multiple Vitamins-Minerals (MULTIVITAMIN WITH MINERALS) tablet Take 1 tablet by mouth daily.      . ramipril (ALTACE) 5 MG capsule Take 5 mg by mouth daily.      Marland Kitchen spironolactone (ALDACTONE) 25 MG tablet Take 0.5 tablets  (12.5 mg total) by mouth daily.  30 tablet  3  . vitamin C (ASCORBIC ACID) 500 MG tablet Take 1,000 mg by mouth daily.        No current facility-administered medications for this visit.    Past Medical History  Diagnosis Date  . GERD (gastroesophageal reflux disease)   . Atrial fibrillation with RVR 11/01/2012  . Adopted     "don't know my family's medical hx" (11/01/2012)  . Exertional shortness of breath   . Orthopnea     "recently" (11/01/2012)  . Anxiety   . Depression     Past Surgical History  Procedure Laterality Date  . No past surgeries    . Tee without cardioversion N/A 11/02/2012    Procedure: TRANSESOPHAGEAL ECHOCARDIOGRAM (TEE);  Surgeon: Ricki Rodriguez, MD;  [facility] ENDOSCOPY;  Service: Cardiovascular;  Laterality: N/A;  . Cardioversion N/A 11/02/2012    Procedure: CARDIOVERSION;  Surgeon: Ricki Rodriguez, MD;  Location: [facility] ENDOSCOPY;  Service: Cardiovascular;  Laterality: N/A;    ROS:  As stated in the HPI and negative for all other systems.  PHYSICAL EXAM BP 138/84  Pulse 57  Ht 6' (1.829 m)  Wt 170 lb (77.111 kg)  BMI 23.05 kg/m2 GENERAL:  Well appearing HEENT:  Pupils equal round and reactive,  fundi not visualized, oral mucosa unremarkable NECK:  No jugular venous distention, waveform within normal limits, carotid upstroke brisk and symmetric, no bruits, no thyromegaly LYMPHATICS:  No cervical, inguinal adenopathy LUNGS:  Clear to auscultation bilaterally BACK:  No CVA tenderness CHEST:  Unremarkable HEART:  PMI not displaced or sustained,S1 and S2 within normal limits, no S3, no S4, no clicks, no rubs, no murmurs ABD:  Flat, positive bowel sounds normal in frequency in pitch, no bruits, no rebound, no guarding, no midline pulsatile mass, no hepatomegaly, no splenomegaly EXT:  2 plus pulses throughout, no edema, no cyanosis no clubbing SKIN:  No rashes no nodules NEURO:  Cranial nerves II through XII grossly intact, motor grossly intact  throughout PSYCH:  Cognitively intact, oriented to person place and time  EKG:  Sinus rhythm, rate 57, axis within normal limits, intervals within normal limits, no acute ST-T wave changes.   ASSESSMENT AND PLAN  ATRIAL FIB:  He has had no recurrence of this. I suspect that eventually I will be able to take him off of the Eliquis.  I would like to reassess an EF of poor idea that and I will do that in several months.  He should stop the ASA.    CARDIOMYOPATHY:  I will continue the meds as listed for now. I will reassess his EF in six months.  I suspect this might have been low with his atrial fib and acute illness.  Hopefully it is better.  RISK REDUCTION:  He has no indication for Lipitor.  He will stop this.

## 2013-09-22 IMAGING — CR DG CHEST 2V
2 series · 2 of 2 positions shown · non-contrast
Comparison: 11/03/2012

CLINICAL DATA: Cough, pneumonia

CHEST - 2 VIEW

[w chest pa]
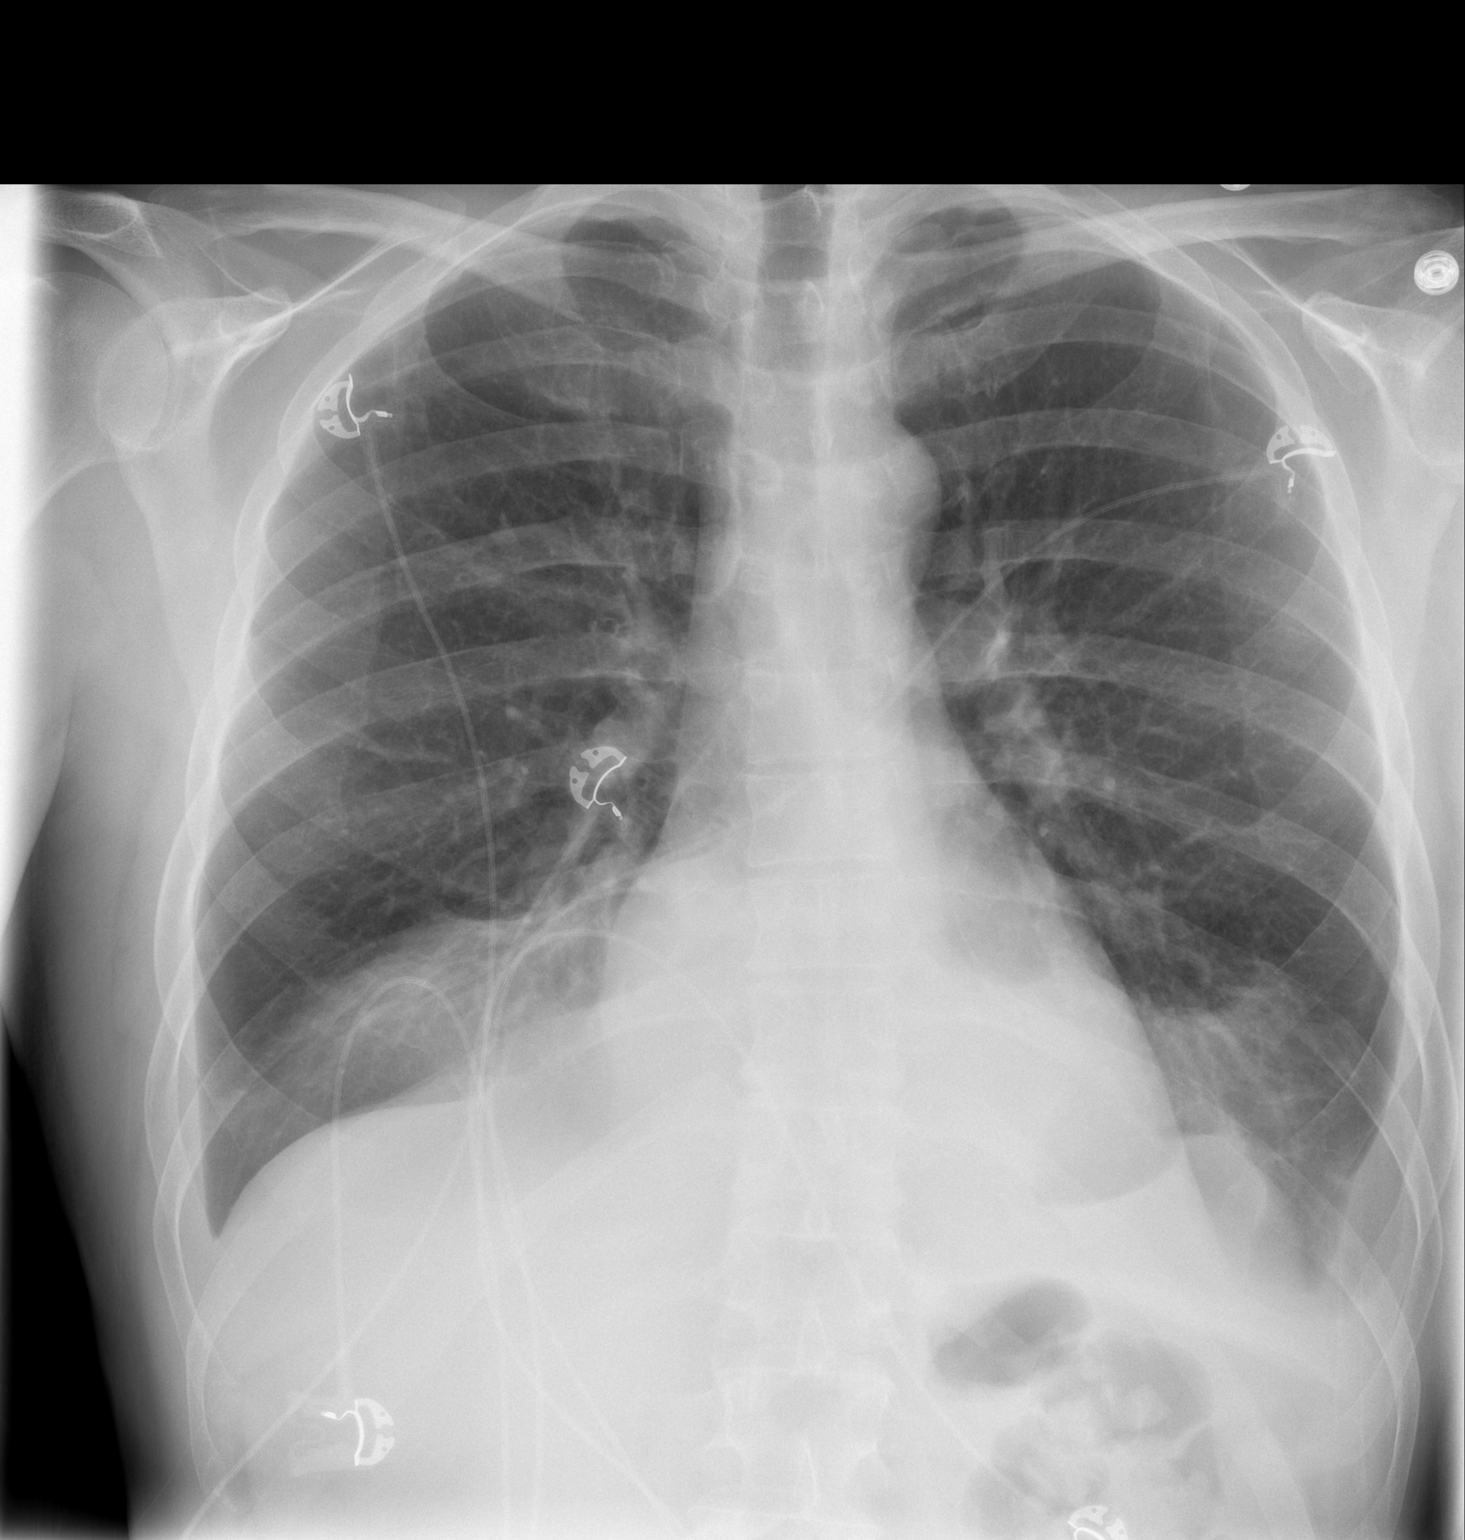

[w chest lat]
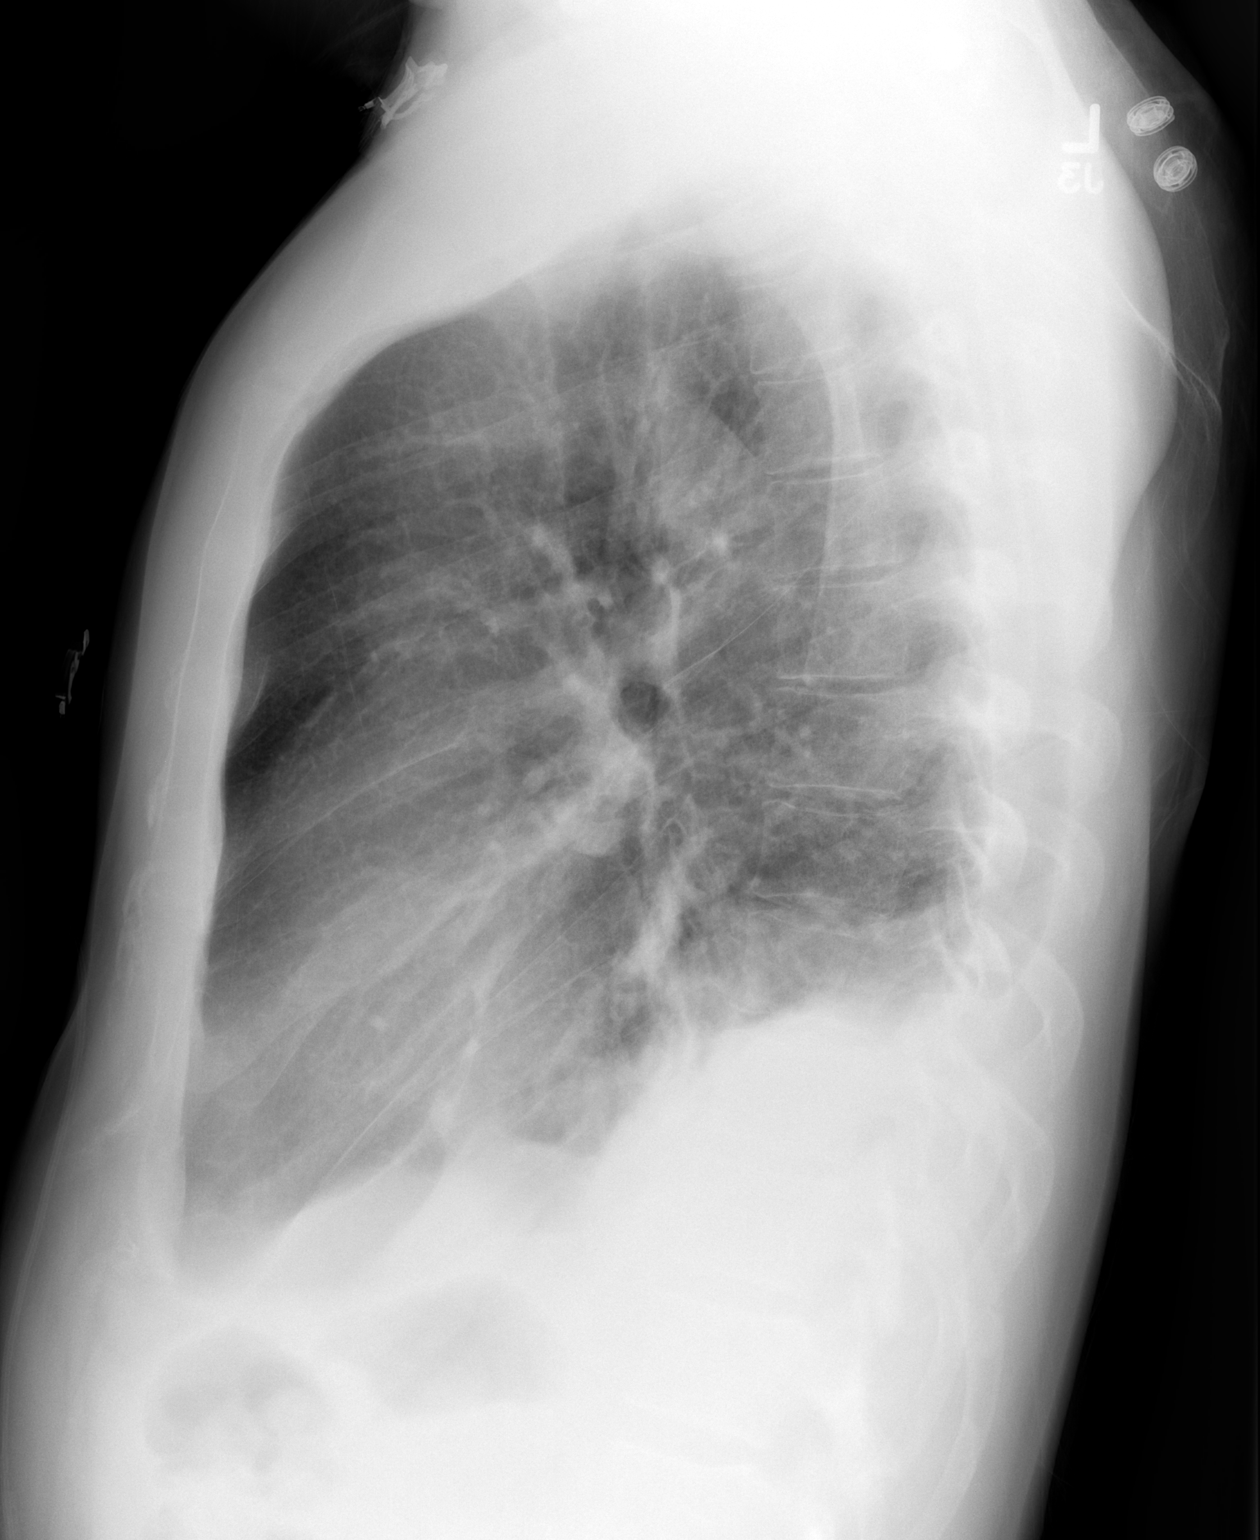

[2 of 2 positions shown; findings below may reference images not displayed]

FINDINGS: Bilateral pleural effusions show mild interval
improvement.  Bibasilar airspace disease also shows improvement.
Point vascularity is not normal.  No edema.  Heart size is within
normal limits.
IMPRESSION: Interval improvement and bilateral pleural effusions and bibasilar
atelectasis.  This may be due to resolving fluid overload.

## 2013-11-18 ENCOUNTER — Other Ambulatory Visit: Payer: Self-pay | Admitting: Cardiology

## 2013-12-11 ENCOUNTER — Other Ambulatory Visit: Payer: Self-pay | Admitting: Cardiology

## 2014-02-28 ENCOUNTER — Encounter: Payer: Self-pay | Admitting: Cardiology

## 2014-02-28 ENCOUNTER — Ambulatory Visit (INDEPENDENT_AMBULATORY_CARE_PROVIDER_SITE_OTHER): Payer: No Typology Code available for payment source | Admitting: Cardiology

## 2014-02-28 VITALS — BP 122/78 | HR 49 | Ht 72.0 in | Wt 173.6 lb

## 2014-02-28 DIAGNOSIS — I4891 Unspecified atrial fibrillation: Secondary | ICD-10-CM

## 2014-02-28 DIAGNOSIS — I428 Other cardiomyopathies: Secondary | ICD-10-CM

## 2014-02-28 DIAGNOSIS — I480 Paroxysmal atrial fibrillation: Secondary | ICD-10-CM

## 2014-02-28 DIAGNOSIS — I429 Cardiomyopathy, unspecified: Secondary | ICD-10-CM

## 2014-02-28 DIAGNOSIS — Z7901 Long term (current) use of anticoagulants: Secondary | ICD-10-CM | POA: Insufficient documentation

## 2014-02-28 NOTE — Patient Instructions (Signed)
We are going to schedule you for your Echo on a day that Dr.Hochrein is in the office so that you can see him as well.

## 2014-02-28 NOTE — Progress Notes (Signed)
I believe the pt was put on my schedule by mistake. In reviewing his LOV it appears he was supposed to have an echo and see Dr Antoine Poche on the same day. His echo was never scheduled and he was put on my schedule in place of Dr Antoine Poche. The pt has been doing well, no complaints since LOV. EKG shows NSR 50. I suggested we reschedule the echo on a day Dr Antoine Poche is in the office and he can afterwards.   Corine Shelter PA-C 02/28/2014 10:41 AM

## 2014-03-27 ENCOUNTER — Other Ambulatory Visit: Payer: Self-pay | Admitting: Cardiology

## 2014-04-09 ENCOUNTER — Other Ambulatory Visit: Payer: Self-pay | Admitting: Cardiology

## 2014-04-15 ENCOUNTER — Encounter: Payer: Self-pay | Admitting: Cardiology

## 2014-04-15 ENCOUNTER — Ambulatory Visit (INDEPENDENT_AMBULATORY_CARE_PROVIDER_SITE_OTHER): Payer: No Typology Code available for payment source | Admitting: Cardiology

## 2014-04-15 ENCOUNTER — Ambulatory Visit (HOSPITAL_COMMUNITY)
Admission: RE | Admit: 2014-04-15 | Discharge: 2014-04-15 | Disposition: A | Discharge status: Home / Self Care | Payer: No Typology Code available for payment source | Source: Ambulatory Visit | Attending: Cardiovascular Disease | Admitting: Cardiovascular Disease

## 2014-04-15 VITALS — BP 138/87 | HR 58 | Ht 72.0 in | Wt 170.9 lb

## 2014-04-15 DIAGNOSIS — I4891 Unspecified atrial fibrillation: Secondary | ICD-10-CM

## 2014-04-15 DIAGNOSIS — I480 Paroxysmal atrial fibrillation: Secondary | ICD-10-CM

## 2014-04-15 DIAGNOSIS — R931 Abnormal findings on diagnostic imaging of heart and coronary circulation: Secondary | ICD-10-CM

## 2014-04-15 DIAGNOSIS — I429 Cardiomyopathy, unspecified: Secondary | ICD-10-CM

## 2014-04-15 DIAGNOSIS — I517 Cardiomegaly: Secondary | ICD-10-CM

## 2014-04-15 DIAGNOSIS — R0609 Other forms of dyspnea: Secondary | ICD-10-CM | POA: Diagnosis not present

## 2014-04-15 DIAGNOSIS — I428 Other cardiomyopathies: Secondary | ICD-10-CM | POA: Insufficient documentation

## 2014-04-15 DIAGNOSIS — R0989 Other specified symptoms and signs involving the circulatory and respiratory systems: Secondary | ICD-10-CM | POA: Insufficient documentation

## 2014-04-15 MED ORDER — METOPROLOL SUCCINATE ER 50 MG PO TB24: 50.0000 mg | ORAL_TABLET | Freq: Every day | ORAL | Status: DC

## 2014-04-15 NOTE — Progress Notes (Signed)
HPI The patient presents for follow up other reduced ejection fraction and atrial fibrillation.Marland Kitchen He was hospitalized in April of last year. He had an pneumonia. He was found to be in atrial fibrillation with a rapid rate. During this evaluation he had an echo and a transesophageal echo as well as a stress perfusion study. He did undergo cardioversion. He was found to have an EF of about 45% with global hypokinesis. However, he did not have any high risk findings suggestive of ischemia. He was converted to sinus rhythm and seemed to have maintained this.  He does report that he had some sensation that he just didn't feel right about a week ago. Of note he was in the office in August by mistake actually had an EKG which demonstrated normal sinus rhythm. However, when he didn't feel right several days ago he had his blood pressure checked and diastolic was mildly elevated. Heart rate was in the 90s. He does not notice it to be irregular. He wasn't having any overt symptoms like shortness of breath, PND or orthopnea. He wasn't noticing any palpitations, presyncope or syncope. However, he did notice that when his usual 89] 2 PM 90 minutes and he had less exercise tolerance.  He drinks maybe one alcoholic drink per night and rarely more.  He has been under some stress at work and with a failed relationship.  Unfortunately an echo done today does demonstrate that his ejection fraction which was 45% appears to the lower. It is now around 30-35% although he does have A. Fib with a rapid rate and is somewhat difficult to assess.    No Known Allergies  Current Outpatient Prescriptions  Medication Sig Dispense Refill  . Cholecalciferol (VITAMIN D PO) Take 2-4 tablets by mouth daily with breakfast.       . Cyanocobalamin (VITAMIN B-12 PO) Take 1 tablet by mouth daily with breakfast.      . ELIQUIS 5 MG TABS tablet take 1 tablet by mouth twice a day  60 tablet  3  . metoprolol succinate (TOPROL-XL) 25 MG 24 hr  tablet take 1 tablet by mouth once daily  30 tablet  3  . ramipril (ALTACE) 5 MG capsule take 1 capsule by mouth once daily  30 capsule  3  . spironolactone (ALDACTONE) 25 MG tablet take 1/2 tablet by mouth daily  15 tablet  0  . vitamin C (ASCORBIC ACID) 500 MG tablet Take 1,000 mg by mouth daily.        No current facility-administered medications for this visit.    Past Medical History  Diagnosis Date  . GERD (gastroesophageal reflux disease)   . Atrial fibrillation with RVR 11/01/2012  . Adopted   . Anxiety   . Depression     Past Surgical History  Procedure Laterality Date  . No past surgeries    . Tee without cardioversion N/A 11/02/2012    Procedure: TRANSESOPHAGEAL ECHOCARDIOGRAM (TEE);  Surgeon: Ricki Rodriguez, MD;  Location: [facility] ENDOSCOPY;  Service: Cardiovascular;  Laterality: N/A;  . Cardioversion N/A 11/02/2012    Procedure: CARDIOVERSION;  Surgeon: Ricki Rodriguez, MD;  Location: [facility] ENDOSCOPY;  Service: Cardiovascular;  Laterality: N/A;    ROS:  As stated in the HPI and negative for all other systems.  PHYSICAL EXAM BP 138/87  Pulse 58  Ht 6' (1.829 m)  Wt 170 lb 14.4 oz (77.52 kg)  BMI 23.17 kg/m2 GENERAL:  Well appearing HEENT:  Pupils equal round and reactive, fundi  not visualized, oral mucosa unremarkable NECK:  No jugular venous distention, waveform within normal limits, carotid upstroke brisk and symmetric, no bruits, no thyromegaly LYMPHATICS:  No cervical, inguinal adenopathy LUNGS:  Clear to auscultation bilaterally BACK:  No CVA tenderness CHEST:  Unremarkable HEART:  PMI not displaced or sustained,S1 and S2 within normal limits, no S3,  no clicks, no rubs, no murmurs, irregular ABD:  Flat, positive bowel sounds normal in frequency in pitch, no bruits, no rebound, no guarding, no midline pulsatile mass, no hepatomegaly, no splenomegaly EXT:  2 plus pulses throughout, no edema, no cyanosis no clubbing SKIN:  No rashes no nodules NEURO:  Cranial nerves II  through XII grossly intact, motor grossly intact throughout PSYCH:  Cognitively intact, oriented to person place and time  EKG:  He will, rate 114, axis within normal limits, intervals within normal limits, nonspecific inferior T wave changes.  04/15/2014   ASSESSMENT AND PLAN  ATRIAL FIB:  Given the fact that he has recurrent atrial fibrillation will need further attention cardioversion with medication. I'm going to increase his beta blocker today. We're going to schedule him to come back for Tikosyn treatment in the hospital with possible cardioversion. He has been on his anticoagulation will continue on this.  To do we will check a TSH with his labs.  I will have a low threshold to involve Dr. Johney Frame and I would like him to be seen by the EP service when he is admitted.  CARDIOMYOPATHY:  Unfortunately his ejection fraction is lower probably related to his atrial fibrillation. I will be titrating his beta blocker and in the hospital his ACE inhibitor. We will reassess this after his head rhythm control. He has no risk factors for obstructive coronary disease and had a perfusion study that did not demonstrate any ischemia. At this point I think cardiac catheterization is indicated.

## 2014-04-15 NOTE — Progress Notes (Signed)
2D Echocardiogram Complete.  04/15/2014   Irvine Glorioso, RDCS  

## 2014-04-15 NOTE — Patient Instructions (Signed)
Your physician recommends that you schedule a follow-up appointment  After you have been  Started on tikosyn   You will be in the hospital for three days during the start of this medication   ( Tikosyn )  Increase you metoptolol to 50 mg

## 2014-04-17 NOTE — Addendum Note (Signed)
Addended by: Vincenza Hews on: 04/17/2014 07:34 AM   Modules accepted: Orders

## 2014-04-27 ENCOUNTER — Inpatient Hospital Stay (HOSPITAL_COMMUNITY)
Admission: RE | Admit: 2014-04-27 | Discharge: 2014-05-01 | DRG: 309 | Disposition: A | Discharge status: Home / Self Care | Payer: No Typology Code available for payment source | Source: Ambulatory Visit | Attending: Cardiology | Admitting: Cardiology

## 2014-04-27 ENCOUNTER — Encounter (HOSPITAL_COMMUNITY): Payer: Self-pay | Admitting: *Deleted

## 2014-04-27 DIAGNOSIS — F329 Major depressive disorder, single episode, unspecified: Secondary | ICD-10-CM | POA: Diagnosis present

## 2014-04-27 DIAGNOSIS — I481 Persistent atrial fibrillation: Principal | ICD-10-CM | POA: Diagnosis present

## 2014-04-27 DIAGNOSIS — I429 Cardiomyopathy, unspecified: Secondary | ICD-10-CM

## 2014-04-27 DIAGNOSIS — I4819 Other persistent atrial fibrillation: Secondary | ICD-10-CM

## 2014-04-27 DIAGNOSIS — F419 Anxiety disorder, unspecified: Secondary | ICD-10-CM | POA: Diagnosis present

## 2014-04-27 DIAGNOSIS — Z7901 Long term (current) use of anticoagulants: Secondary | ICD-10-CM

## 2014-04-27 DIAGNOSIS — I4891 Unspecified atrial fibrillation: Secondary | ICD-10-CM | POA: Diagnosis present

## 2014-04-27 DIAGNOSIS — Z79899 Other long term (current) drug therapy: Secondary | ICD-10-CM

## 2014-04-27 DIAGNOSIS — K219 Gastro-esophageal reflux disease without esophagitis: Secondary | ICD-10-CM | POA: Diagnosis present

## 2014-04-27 DIAGNOSIS — F1099 Alcohol use, unspecified with unspecified alcohol-induced disorder: Secondary | ICD-10-CM | POA: Diagnosis present

## 2014-04-27 DIAGNOSIS — I428 Other cardiomyopathies: Secondary | ICD-10-CM | POA: Diagnosis present

## 2014-04-27 DIAGNOSIS — I959 Hypotension, unspecified: Secondary | ICD-10-CM | POA: Diagnosis present

## 2014-04-27 HISTORY — DX: Cardiac arrhythmia, unspecified: I49.9

## 2014-04-27 HISTORY — DX: Heart disease, unspecified: I51.9

## 2014-04-27 HISTORY — DX: Paroxysmal atrial fibrillation: I48.0

## 2014-04-27 LAB — COMPREHENSIVE METABOLIC PANEL
ALT: 24 U/L (ref 0–53)
AST: 16 U/L (ref 0–37)
Albumin: 3.7 g/dL (ref 3.5–5.2)
Alkaline Phosphatase: 70 U/L (ref 39–117)
Anion gap: 11 (ref 5–15)
BUN: 20 mg/dL (ref 6–23)
CO2: 27 mEq/L (ref 19–32)
Calcium: 9.2 mg/dL (ref 8.4–10.5)
Chloride: 101 mEq/L (ref 96–112)
Creatinine, Ser: 1.02 mg/dL (ref 0.50–1.35)
GFR calc Af Amer: >90 mL/min (ref >90)
GFR calc non Af Amer: 81 mL/min — ABNORMAL LOW (ref >90)
Glucose, Bld: 93 mg/dL (ref 70–99)
Potassium: 4.2 mEq/L (ref 3.7–5.3)
Sodium: 139 mEq/L (ref 137–147)
Total Bilirubin: 0.4 mg/dL (ref 0.3–1.2)
Total Protein: 7 g/dL (ref 6.0–8.3)

## 2014-04-27 LAB — CBC
HCT: 41.6 % (ref 39.0–52.0)
Hemoglobin: 14.9 g/dL (ref 13.0–17.0)
MCH: 33 pg (ref 26.0–34.0)
MCHC: 35.8 g/dL (ref 30.0–36.0)
MCV: 92.2 fL (ref 78.0–100.0)
Platelets: 239 10*3/uL (ref 150–400)
RBC: 4.51 MIL/uL (ref 4.22–5.81)
RDW: 12.4 % (ref 11.5–15.5)
WBC: 8.1 10*3/uL (ref 4.0–10.5)

## 2014-04-27 LAB — MAGNESIUM: Magnesium: 2.1 mg/dL (ref 1.5–2.5)

## 2014-04-27 LAB — TSH: TSH: 2.22 u[IU]/mL (ref 0.350–4.500)

## 2014-04-27 MED ORDER — APIXABAN 5 MG PO TABS
5.0000 mg | ORAL_TABLET | Freq: Two times a day (BID) | ORAL | Status: DC
  Administered 2014-04-27 – 2014-05-01 (×8): 5 mg via ORAL
  Filled 2014-04-27 (×9): qty 1

## 2014-04-27 MED ORDER — DOFETILIDE 500 MCG PO CAPS
500.0000 ug | ORAL_CAPSULE | Freq: Two times a day (BID) | ORAL | Status: DC
  Administered 2014-04-27 – 2014-04-30 (×6): 500 ug via ORAL
  Filled 2014-04-27 (×9): qty 1

## 2014-04-27 MED ORDER — SPIRONOLACTONE 12.5 MG HALF TABLET
12.5000 mg | ORAL_TABLET | Freq: Every day | ORAL | Status: DC
  Administered 2014-04-28 – 2014-05-01 (×4): 12.5 mg via ORAL
  Filled 2014-04-27 (×4): qty 1

## 2014-04-27 MED ORDER — ZOLPIDEM TARTRATE 5 MG PO TABS: 5.0000 mg | ORAL_TABLET | Freq: Every evening | ORAL | Status: DC | PRN

## 2014-04-27 MED ORDER — ALPRAZOLAM 0.25 MG PO TABS: 0.2500 mg | ORAL_TABLET | Freq: Two times a day (BID) | ORAL | Status: DC | PRN

## 2014-04-27 MED ORDER — SODIUM CHLORIDE 0.9 % IJ SOLN
3.0000 mL | Freq: Two times a day (BID) | INTRAMUSCULAR | Status: DC
  Administered 2014-04-28 – 2014-05-01 (×6): 3 mL via INTRAVENOUS

## 2014-04-27 MED ORDER — VITAMIN C 500 MG PO TABS
1000.0000 mg | ORAL_TABLET | Freq: Every day | ORAL | Status: DC
  Administered 2014-04-28 – 2014-05-01 (×4): 1000 mg via ORAL
  Filled 2014-04-27 (×4): qty 2

## 2014-04-27 MED ORDER — RAMIPRIL 5 MG PO CAPS
5.0000 mg | ORAL_CAPSULE | Freq: Every day | ORAL | Status: DC
  Administered 2014-04-28 – 2014-05-01 (×4): 5 mg via ORAL
  Filled 2014-04-27 (×4): qty 1

## 2014-04-27 MED ORDER — METOPROLOL SUCCINATE ER 50 MG PO TB24
50.0000 mg | ORAL_TABLET | Freq: Every day | ORAL | Status: DC
  Administered 2014-04-28 – 2014-04-29 (×2): 50 mg via ORAL
  Filled 2014-04-27 (×2): qty 1

## 2014-04-27 MED ORDER — ONDANSETRON HCL 4 MG/2ML IJ SOLN: 4.0000 mg | Freq: Four times a day (QID) | INTRAMUSCULAR | Status: DC | PRN

## 2014-04-27 MED ORDER — ACETAMINOPHEN 325 MG PO TABS
650.0000 mg | ORAL_TABLET | ORAL | Status: DC | PRN
  Administered 2014-04-30: 650 mg via ORAL
  Filled 2014-04-27: qty 2

## 2014-04-27 NOTE — Progress Notes (Signed)
Patient ID: [name] [name] MRN: [id number], DOB/AGE: [birth year]   Admit date: 04/27/2014   Primary Physician: Thane Edu, MD Primary Cardiologist: Dr Antoine Poche  HPI: The patient was seen by Dr Antoine Poche for follow up 04/15/14. He has reduced ejection fraction and atrial fibrillation.Marland Kitchen He was hospitalized in April of last year. He had an pneumonia. He was found to be in atrial fibrillation with a rapid rate. During this evaluation he had an echo and a transesophageal echo as well as a stress perfusion study. He did undergo cardioversion. He was found to have an EF of about 45% with global hypokinesis. However, he did not have any high risk findings suggestive of ischemia. He was converted to sinus rhythm and seemed to have maintained this.  He does report that he had some sensation that he just didn't feel right about a week ago. Of note he was in the office in August by mistake actually had an EKG which demonstrated normal sinus rhythm. However, when he didn't feel right several days ago he had his blood pressure checked and diastolic was mildly elevated. Heart rate was in the 90s. He does not notice it to be irregular. He wasn't having any overt symptoms like shortness of breath, PND or orthopnea. He wasn't noticing any palpitations, presyncope or syncope. However, he did notice that when his usual 89] 2 PM 90 minutes and he had less exercise tolerance. He drinks maybe one alcoholic drink per night and rarely more. He has been under some stress at work and with a failed relationship. Unfortunately an echo done today does demonstrate that his ejection fraction which was 45% appears to the lower. It is now around 30-35% although he does have A. Fib with a rapid rate and is somewhat difficult to assess.     Problem List: Past Medical History  Diagnosis Date  . GERD (gastroesophageal reflux disease)   . Atrial fibrillation with RVR 11/01/2012  . Adopted   . Anxiety   .  Depression     Past Surgical History  Procedure Laterality Date  . No past surgeries    . Tee without cardioversion N/A 11/02/2012    Procedure: TRANSESOPHAGEAL ECHOCARDIOGRAM (TEE);  Surgeon: Ricki Rodriguez, MD;  Location: [facility] ENDOSCOPY;  Service: Cardiovascular;  Laterality: N/A;  . Cardioversion N/A 11/02/2012    Procedure: CARDIOVERSION;  Surgeon: Ricki Rodriguez, MD;  Location: [facility] ENDOSCOPY;  Service: Cardiovascular;  Laterality: N/A;     Allergies: No Known Allergies   Home Medications Current Facility-Administered Medications  Medication Dose Route Frequency Provider Last Rate Last Dose  . acetaminophen (TYLENOL) tablet 650 mg  650 mg Oral Q4H PRN Abelino Derrick, PA-C      . ALPRAZolam Prudy Feeler) tablet 0.25 mg  0.25 mg Oral BID PRN Abelino Derrick, PA-C      . apixaban (ELIQUIS) tablet 5 mg  5 mg Oral BID Eda Paschal Kilroy, PA-C      . [START ON 04/28/2014] metoprolol succinate (TOPROL-XL) 24 hr tablet 50 mg  50 mg Oral Daily Luke K Kilroy, PA-C      . ondansetron [facility]) injection 4 mg  4 mg Intravenous Q6H PRN Abelino Derrick, PA-C      . [START ON 04/28/2014] ramipril (ALTACE) capsule 5 mg  5 mg Oral Daily Luke K Kilroy, PA-C      . [START ON 04/28/2014] spironolactone (ALDACTONE) tablet 12.5 mg  12.5 mg Oral Daily Abelino Derrick, PA-C      . [  START ON 04/28/2014] vitamin C (ASCORBIC ACID) tablet 1,000 mg  1,000 mg Oral Daily Luke K Kilroy, PA-C      . zolpidem (AMBIEN) tablet 5 mg  5 mg Oral QHS PRN,MR X 1 Abelino DerrickLuke K Kilroy, PA-C         Family History  Problem Relation Age of Onset  . Adopted: Yes     History   Social History  . Marital Status: Divorced    Spouse Name: N/A    Number of Children: 1  . Years of Education: N/A   Occupational History  .     Social History Main Topics  . Smoking status: Never Smoker   . Smokeless tobacco: Never Used  . Alcohol Use: 4.2 oz/week    7 Cans of beer per week     Comment: 11/01/2012 "55 beer/day, average"  . Drug Use: Yes    Special:  Marijuana     Comment: 11/01/2012 "last marijuana ~ 53 yr ago"  . Sexual Activity: Not Currently   Other Topics Concern  . Not on file   Social History Narrative   Lives alone.       Review of Systems: General: negative for chills, fever, night sweats or weight changes.  Cardiovascular: negative for chest pain, dyspnea on exertion, edema, orthopnea, palpitations, paroxysmal nocturnal dyspnea or shortness of breath Dermatological: negative for rash Respiratory: negative for cough or wheezing Urologic: negative for hematuria Abdominal: negative for nausea, vomiting, diarrhea, bright red blood per rectum, melena, or hematemesis Neurologic: negative for visual changes, syncope, or dizziness All other systems reviewed and are otherwise negative except as noted above.  Physical Exam: Blood pressure 109/76, pulse 64, temperature 98.1 F (36.7 C), temperature source Oral, resp. rate 18, height 6' (1.829 m), SpO2 99.00%.  General appearance: alert, cooperative and no distress Neck: no carotid bruit and no JVD Lungs: clear to auscultation bilaterally Heart: irregularly irregular rhythm Abdomen: soft, non-tender; bowel sounds normal; no masses,  no organomegaly Extremities: extremities normal, atraumatic, no cyanosis or edema Pulses: 2+ and symmetric Skin: Skin color, texture, turgor normal. No rashes or lesions Neurologic: Grossly normal    Labs:  No results found for this or any previous visit (from the past 24 hour(s)). pending   Radiology/Studies: No results found.  ZOX:[id number]:pending Telemetry AF with QTC 360  ASSESSMENT AND PLAN:  Active Problems:   Atrial fibrillation   PLAN: See office note from 04/15/14 by Dr Antoine PocheHochrein. Pt admitted from home for Tikosyn loading. He has done well since seen by Dr Antoine PocheHochrein. He denies dyspnea or awareness of AF. QTC 360 on telemetry. Pharmacy consulted, labs ordered.  *Please note that the office note should state in the A/P that a cath is NOT  indicated.  Deland PrettySigned, KILROY,LUKE K, PA-C 04/27/2014, 5:02 PM

## 2014-04-27 NOTE — Progress Notes (Signed)
Pharmacy Consult for Dofetilide (Tikosyn) Initiation  Admit Complaint: 53 y.o. male admitted 04/27/2014 with atrial fibrillation to be initiated on dofetilide.   Assessment:  Patient Exclusion Criteria: If any screening criteria checked as "Yes", then  patient  should NOT receive dofetilide until criteria item is corrected. If "Yes" please indicate correction plan.  YES  NO Patient  Exclusion Criteria Correction Plan  []  [x]  Baseline QTc interval is greater than or equal to 440 msec. IF above YES box checked dofetilide contraindicated unless patient has ICD; then may proceed if QTc 500-550 msec or with known ventricular conduction abnormalities may proceed with QTc 550-600 msec. QTc =     []  [x]  Magnesium level is less than 1.8 mEq/l : Last magnesium:  Lab Results  Component Value Date   MG 2.0 11/01/2012         []  [x]  Potassium level is less than 4 mEq/l : Last potassium:  Lab Results  Component Value Date   K 4.4 11/02/2012         []  [x]  Patient is known or suspected to have a digoxin level greater than 2 ng/ml: No results found for this basename: DIGOXIN      []  [x]  Creatinine clearance less than 20 ml/min (calculated using Cockcroft-Gault, actual body weight and serum creatinine): The CrCl is unknown because both a height and weight (above a minimum accepted value) are required for this calculation.    []  [x]  Patient has received drugs known to prolong the QT intervals within the last 48 hours(phenothiazines, tricyclics or tetracyclic antidepressants, erythromycin, H-1 antihistamines, cisapride, fluoroquinolones, azithromycin). Drugs not listed above may have an, as yet, undetected potential to prolong the QT interval, updated information on QT prolonging agents is available at this website:QT prolonging agents   []  [x]  Patient received a dose of hydrochlorothiazide (Oretic) alone or in any combination including triamterene (Dyazide, Maxzide) in the last 48 hours.   []  [x]   Patient received a medication known to increase dofetilide plasma concentrations prior to initial dofetilide dose:    Trimethoprim (Primsol, Proloprim) in the last 36 hours   Verapamil (Calan, Verelan) in the last 36 hours or a sustained release dose in the last 72 hours   Megestrol (Megace) in the last 5 days    Cimetidine (Tagamet) in the last 6 hours   Ketoconazole (Nizoral) in the last 24 hours   Itraconazole (Sporanox) in the last 48 hours    Prochlorperazine (Compazine) in the last 36 hours    []  [x]  Patient is known to have a history of torsades de pointes; congenital or acquired long QT syndromes.   []  [x]  Patient has received a Class 1 antiarrhythmic with less than 2 half-lives since last dose. (Disopyramide, Quinidine, Procainamide, Lidocaine, Mexiletine, Flecainide, Propafenone)   []  [x]  Patient has received amiodarone therapy in the past 3 months or amiodarone level is greater than 0.3 ng/ml.    Patient has been appropriately anticoagulated with apixaban 5mg  BID Ordering provider was confirmed at [email or web address] if they are not listed on the [facility] Authorized Prescribers list.  Goal of Therapy:  Follow renal function, electrolytes, potential drug interactions, and dose adjustment. Provide education and 1 week supply at discharge.  Plan:  1.  Initiate dofetilide based on renal function: Select One Calculated CrCl  Dose q12h  [x]  > 60 ml/min 500 mcg  []  40-60 ml/min 250 mcg  []  20-40 ml/min 125 mcg   2. Follow up QTc after the first 5 doses, renal function, electrolytes (  K & Mg) daily x 3     days, dose adjustment, success of initiation and facilitate 1 week discharge supply as     clinically indicated.  3. Initiate Tikosyn education video (Call [phone] and ask for video # [phone]).  4. Place Enrollment Form on the chart for discharge supply of dofetilide.   Leota Sauers Pharm.D. CPP, BCPS Clinical Pharmacist [phone] 04/27/2014 6:13 PM

## 2014-04-28 DIAGNOSIS — I429 Cardiomyopathy, unspecified: Secondary | ICD-10-CM

## 2014-04-28 DIAGNOSIS — I481 Persistent atrial fibrillation: Principal | ICD-10-CM

## 2014-04-28 LAB — BASIC METABOLIC PANEL
Anion gap: 9 (ref 5–15)
BUN: 15 mg/dL (ref 6–23)
CHLORIDE: 103 meq/L (ref 96–112)
CO2: 27 mEq/L (ref 19–32)
Calcium: 8.8 mg/dL (ref 8.4–10.5)
Creatinine, Ser: 0.98 mg/dL (ref 0.50–1.35)
Glucose, Bld: 93 mg/dL (ref 70–99)
POTASSIUM: 4.1 meq/L (ref 3.7–5.3)
SODIUM: 139 meq/L (ref 137–147)

## 2014-04-28 LAB — MAGNESIUM: Magnesium: 2 mg/dL (ref 1.5–2.5)

## 2014-04-28 NOTE — Progress Notes (Signed)
Subjective: No specific complaint  Objective: Vital signs in last 24 hours: Temp:  [97.7 F (36.5 C)-98.1 F (36.7 C)] 97.7 F (36.5 C) (10/05 0500) Pulse Rate:  [64-96] 86 (10/05 0500) Resp:  [16-18] 16 (10/05 0500) BP: (99-119)/(59-79) 99/59 mmHg (10/05 0500) SpO2:  [99 %-100 %] 99 % (10/05 0500) Weight:  [169 lb 12.1 oz (77 kg)] 169 lb 12.1 oz (77 kg) (10/04 1900) Last BM Date: 04/27/14  Intake/Output from previous day: 10/04 0701 - 10/05 0700 In: 240 [P.O.:240] Out: -  Intake/Output this shift:    Medications Current Facility-Administered Medications  Medication Dose Route Frequency Provider Last Rate Last Dose  . acetaminophen (TYLENOL) tablet 650 mg  650 mg Oral Q4H PRN Abelino Derrick, PA-C      . ALPRAZolam Prudy Feeler) tablet 0.25 mg  0.25 mg Oral BID PRN Abelino Derrick, PA-C      . apixaban (ELIQUIS) tablet 5 mg  5 mg Oral BID Abelino Derrick, PA-C   5 mg at 04/27/14 2216  . dofetilide (TIKOSYN) capsule 500 mcg  500 mcg Oral BID Rollene Rotunda, MD   500 mcg at 04/27/14 2216  . metoprolol succinate (TOPROL-XL) 24 hr tablet 50 mg  50 mg Oral Daily Luke K Kilroy, PA-C      . ondansetron [facility] injection 4 mg  4 mg Intravenous Q6H PRN Eda Paschal Kilroy, PA-C      . ramipril (ALTACE) capsule 5 mg  5 mg Oral Daily Luke K Kilroy, PA-C      . sodium chloride 0.9 % injection 3 mL  3 mL Intravenous Q12H Luke K Kilroy, PA-C      . spironolactone (ALDACTONE) tablet 12.5 mg  12.5 mg Oral Daily Luke K Kilroy, PA-C      . vitamin C (ASCORBIC ACID) tablet 1,000 mg  1,000 mg Oral Daily Luke K Kilroy, PA-C      . zolpidem (AMBIEN) tablet 5 mg  5 mg Oral QHS PRN,MR X 1 Abelino Derrick, PA-C        PE: General appearance: alert, cooperative and no distress Lungs: clear to auscultation bilaterally Heart: irregularly irregular rhythm and No MRG Extremities: No LEE Pulses: 2+ and symmetric Skin: Warm and dry Neurologic: Grossly normal  Lab Results:   Recent Labs  04/27/14 1814  WBC  8.1  HGB 14.9  HCT 41.6  PLT 239   BMET  Recent Labs  04/27/14 1814 04/28/14 0442  NA 139 139  K 4.2 4.1  CL 101 103  CO2 27 27  GLUCOSE 93 93  BUN 20 15  CREATININE 1.02 0.98  CALCIUM 9.2 8.8     Assessment/Plan    Active Problems:   Atrial fibrillation  Plan:  Admitted for tikosyn loading. First dose at 2216hrs.  QTc-429ms(9/22)>> last night>> at 0100hrs.  Mildly hypotensive.  HR controlled.  Eliquis 5mg .  Toprol XL 50. Ramipril 5. Spironolactone.  Bmet stable. Continue loading. Next dose is at 1000hrs.    LOS: 1 day    HAGER, BRYAN PA-C 04/28/2014 7:40 AM  I have seen and examined the patient along with Wilburt Finlay, PA.  I have reviewed the chart, notes and new data.  I agree with PA's note.  Key new complaints: asymptomatic Key examination changes: AF controlled rate, no S3 or JVD or rales or edema Key new findings / data: QTc 416 ms  PLAN: Continue dofetilide loading.  Thurmon Fair, MD, [facility] [phone]  04/28/2014, 10:27 AM

## 2014-04-28 NOTE — Progress Notes (Signed)
Utilization review completed. Marcin Holte, RN, BSN. 

## 2014-04-28 NOTE — Care Management Note (Signed)
  Page 1 of 1   04/28/2014     1:45:30 PM CARE MANAGEMENT NOTE 04/28/2014  Patient:  [name] [name]   Account Number:  [id number]  Date Initiated:  04/28/2014  Documentation initiated by:  Donn Pierini  Subjective/Objective Assessment:   Pt admitted for Tikosyn load     Action/Plan:   PTA pt lived at home   Anticipated DC Date:  04/30/2014   Anticipated DC Plan:  HOME/SELF CARE      DC Planning Services  CM consult  Medication Assistance      Choice offered to / List presented to:             Status of service:  In process, will continue to follow Medicare Important Message given?   (If response is "NO", the following Medicare IM given date fields will be blank) Date Medicare IM given:   Medicare IM given by:   Date Additional Medicare IM given:   Additional Medicare IM given by:    Discharge Disposition:    Per UR Regulation:  Reviewed for med. necessity/level of care/duration of stay  If discussed at Long Length of Stay Meetings, dates discussed:    Comments:  04/28/14- 1300- Donn Pierini RN, BSN [phone] Referral for Tikosyn- per benefits check:  per rep at [facility]: medication is covered/ no auth required/ co-pay at retail $0.31 patient can use: [facility], [facility], and  [facility] Pt will need 7 day script that will be filled here prior to discharge plus orginal 30 day script. NCM to check with pt regarding pharmacy to be sure they are in the [employer]. Spoke with pt - who states he uses- [facility] on [address]- call made to pharmacy- spoke with pharmacist- who verified that they are in the Tikosyn program and can order drug once they have script.

## 2014-04-29 LAB — BASIC METABOLIC PANEL
ANION GAP: 11 (ref 5–15)
BUN: 16 mg/dL (ref 6–23)
CHLORIDE: 101 meq/L (ref 96–112)
CO2: 29 meq/L (ref 19–32)
Calcium: 9.3 mg/dL (ref 8.4–10.5)
Creatinine, Ser: 1.07 mg/dL (ref 0.50–1.35)
GFR calc Af Amer: 88 mL/min — ABNORMAL LOW (ref >90)
GFR calc non Af Amer: 76 mL/min — ABNORMAL LOW (ref >90)
Glucose, Bld: 93 mg/dL (ref 70–99)
Potassium: 5 mEq/L (ref 3.7–5.3)
Sodium: 141 mEq/L (ref 137–147)

## 2014-04-29 LAB — MAGNESIUM: MAGNESIUM: 2.1 mg/dL (ref 1.5–2.5)

## 2014-04-29 MED ORDER — METOPROLOL SUCCINATE ER 50 MG PO TB24
50.0000 mg | ORAL_TABLET | Freq: Once | ORAL | Status: AC
  Administered 2014-04-29: 50 mg via ORAL
  Filled 2014-04-29: qty 1

## 2014-04-29 MED ORDER — METOPROLOL SUCCINATE ER 100 MG PO TB24
100.0000 mg | ORAL_TABLET | Freq: Every day | ORAL | Status: DC
  Administered 2014-04-30 – 2014-05-01 (×2): 100 mg via ORAL
  Filled 2014-04-29 (×2): qty 1

## 2014-04-29 NOTE — Progress Notes (Signed)
  Patient Name: [name] [name] Date of Encounter: 04/29/2014  Active Problems:   Atrial fibrillation   Length of Stay: 2  SUBJECTIVE  Slept well, no complaints.  QT longer, but still in AF and has persistent RVR.  CURRENT MEDS . apixaban  5 mg Oral BID  . dofetilide  500 mcg Oral BID  . [START ON 04/30/2014] metoprolol succinate  100 mg Oral Daily  . metoprolol succinate  50 mg Oral Once  . ramipril  5 mg Oral Daily  . sodium chloride  3 mL Intravenous Q12H  . spironolactone  12.5 mg Oral Daily  . vitamin C  1,000 mg Oral Daily    OBJECTIVE   Intake/Output Summary (Last 24 hours) at 04/29/14 0942 Last data filed at 04/29/14 0847  Gross per 24 hour  Intake    480 ml  Output      0 ml  Net    480 ml   Filed Weights   04/27/14 1900  Weight: 77 kg (169 lb 12.1 oz)    PHYSICAL EXAM Filed Vitals:   04/28/14 1358 04/28/14 2100 04/29/14 0115 04/29/14 0848  BP: 100/72 111/74 113/78 99/81  Pulse: 91 110 107 120  Temp: 98.3 F (36.8 C) 98.3 F (36.8 C) 97.6 F (36.4 C)   TempSrc: Oral  Oral   Resp: 18 18 18    Height:      Weight:      SpO2: 98% 99% 100%    General: Alert, oriented x3, no distress Head: no evidence of trauma, PERRL, EOMI, no exophtalmos or lid lag, no myxedema, no xanthelasma; normal ears, nose and oropharynx Neck: normal jugular venous pulsations and no hepatojugular reflux; brisk carotid pulses without delay and no carotid bruits Chest: clear to auscultation, no signs of consolidation by percussion or palpation, normal fremitus, symmetrical and full respiratory excursions Cardiovascular: normal position and quality of the apical impulse, irregular rhythm, normal first and second heart sounds, no rubs or gallops, no murmur Abdomen: no tenderness or distention, no masses by palpation, no abnormal pulsatility or arterial bruits, normal bowel sounds, no hepatosplenomegaly Extremities: no clubbing, cyanosis or edema; 2+ radial, ulnar and brachial pulses  bilaterally; 2+ right femoral, posterior tibial and dorsalis pedis pulses; 2+ left femoral, posterior tibial and dorsalis pedis pulses; no subclavian or femoral bruits Neurological: grossly nonfocal  LABS  CBC  Recent Labs  04/27/14 1814  WBC 8.1  HGB 14.9  HCT 41.6  MCV 92.2  PLT 239   Basic Metabolic Panel  Recent Labs  04/28/14 0442 04/29/14 0527  NA 139 141  K 4.1 5.0  CL 103 101  CO2 27 29  GLUCOSE 93 93  BUN 15 16  CREATININE 0.98 1.07  CALCIUM 8.8 9.3  MG 2.0 2.1   Liver Function Tests  Recent Labs  04/27/14 1814  AST 16  ALT 24  ALKPHOS 70  BILITOT 0.4  PROT 7.0  ALBUMIN 3.7    Recent Labs  04/27/14 1814  TSH 2.220    Radiology Studies Imaging results have been reviewed and No results found.  TELE AF RVR  ECG AF QTC 492  ASSESSMENT AND PLAN Tentatively scheduled for elective DCCV tomorrow if he is still in AFib. Increase metoprolol today   Thurmon Fair, MD, [facility] [phone] office [phone] pager 04/29/2014 9:42 AM

## 2014-04-30 ENCOUNTER — Other Ambulatory Visit: Payer: Self-pay | Admitting: Physician Assistant

## 2014-04-30 ENCOUNTER — Encounter (HOSPITAL_COMMUNITY): Payer: Self-pay | Admitting: *Deleted

## 2014-04-30 ENCOUNTER — Inpatient Hospital Stay (HOSPITAL_COMMUNITY): Payer: No Typology Code available for payment source | Admitting: Anesthesiology

## 2014-04-30 ENCOUNTER — Encounter (HOSPITAL_COMMUNITY): Payer: No Typology Code available for payment source | Admitting: Anesthesiology

## 2014-04-30 ENCOUNTER — Encounter (HOSPITAL_COMMUNITY)
Admission: RE | Disposition: A | Discharge status: Home / Self Care | Payer: Self-pay | Source: Ambulatory Visit | Attending: Cardiology

## 2014-04-30 DIAGNOSIS — I480 Paroxysmal atrial fibrillation: Secondary | ICD-10-CM

## 2014-04-30 HISTORY — PX: CARDIOVERSION: SHX1299

## 2014-04-30 LAB — BASIC METABOLIC PANEL
Anion gap: 12 (ref 5–15)
BUN: 18 mg/dL (ref 6–23)
CO2: 27 mEq/L (ref 19–32)
Calcium: 9.2 mg/dL (ref 8.4–10.5)
Chloride: 101 mEq/L (ref 96–112)
Creatinine, Ser: 1.14 mg/dL (ref 0.50–1.35)
GFR calc non Af Amer: 71 mL/min — ABNORMAL LOW (ref >90)
GFR, EST AFRICAN AMERICAN: 82 mL/min — AB (ref >90)
Glucose, Bld: 96 mg/dL (ref 70–99)
POTASSIUM: 4.7 meq/L (ref 3.7–5.3)
SODIUM: 140 meq/L (ref 137–147)

## 2014-04-30 LAB — MAGNESIUM: Magnesium: 2.1 mg/dL (ref 1.5–2.5)

## 2014-04-30 SURGERY — CARDIOVERSION
Anesthesia: General

## 2014-04-30 MED ORDER — PROPOFOL 10 MG/ML IV BOLUS
INTRAVENOUS | Status: DC | PRN
  Administered 2014-04-30: 50 mg via INTRAVENOUS
  Administered 2014-04-30: 150 mg via INTRAVENOUS

## 2014-04-30 MED ORDER — HYDROCORTISONE 1 % EX CREA: 1.0000 "application " | TOPICAL_CREAM | Freq: Three times a day (TID) | CUTANEOUS | Status: DC | PRN

## 2014-04-30 MED ORDER — LIDOCAINE HCL (CARDIAC) 20 MG/ML IV SOLN
INTRAVENOUS | Status: DC | PRN
  Administered 2014-04-30: 50 mg via INTRAVENOUS

## 2014-04-30 MED ORDER — SODIUM CHLORIDE 0.9 % IV SOLN
INTRAVENOUS | Status: DC
  Administered 2014-04-30: 500 mL via INTRAVENOUS
  Administered 2014-04-30: 16:00:00 via INTRAVENOUS

## 2014-04-30 NOTE — Anesthesia Preprocedure Evaluation (Addendum)
Anesthesia Evaluation  Patient identified by MRN, date of birth, ID band Patient awake    Reviewed: Allergy & Precautions, NPO status , Patient's Chart, lab work & pertinent test results, reviewed documented beta blocker date and time   History of Anesthesia Complications Negative for: history of anesthetic complications  Airway Mallampati: II TM Distance: >3 FB Neck ROM: Full    Dental  (+) Teeth Intact, Dental Advisory Given   Pulmonary neg pulmonary ROS,  breath sounds clear to auscultation        Cardiovascular + dysrhythmias Rhythm:Irregular Rate:Normal     Neuro/Psych negative neurological ROS     GI/Hepatic Neg liver ROS, GERD-  Medicated and Controlled,  Endo/Other  negative endocrine ROS  Renal/GU negative Renal ROS  negative genitourinary   Musculoskeletal negative musculoskeletal ROS (+)   Abdominal   Peds  Hematology negative hematology ROS (+)   Anesthesia Other Findings   Reproductive/Obstetrics negative OB ROS                         Anesthesia Physical Anesthesia Plan  ASA: II  Anesthesia Plan: General   Post-op Pain Management:    Induction: Intravenous  Airway Management Planned: Mask  Additional Equipment:   Intra-op Plan:   Post-operative Plan:   Informed Consent: I have reviewed the patients History and Physical, chart, labs and discussed the procedure including the risks, benefits and alternatives for the proposed anesthesia with the patient or authorized representative who has indicated his/her understanding and acceptance.     Plan Discussed with:   Anesthesia Plan Comments:         Anesthesia Quick Evaluation

## 2014-04-30 NOTE — Anesthesia Postprocedure Evaluation (Signed)
  Anesthesia Post-op Note  Patient: [name] [name]  Procedure(s) Performed: Procedure(s): CARDIOVERSION (N/A)  Patient Location: Endoscopy Unit  Anesthesia Type:General  Level of Consciousness: awake and alert   Airway and Oxygen Therapy: Patient Spontanous Breathing  Post-op Pain: none  Post-op Assessment: Post-op Vital signs reviewed  Post-op Vital Signs: stable  Last Vitals:  Filed Vitals:   04/30/14 1555  BP: 110/87  Pulse: 115  Temp:   Resp: 23    Complications: No apparent anesthesia complications

## 2014-04-30 NOTE — Progress Notes (Addendum)
Initially it was felt the patient was for discharge if DCCV was successful today.  However, attempts at DCCV x 3 were unsuccessful. Discussed with Dr. Royann Shivers who discussed with Dr. Antoine Poche who really would like the patient in NSR. He would like EP to see the patient, preferably Dr. Johney Frame. Dr. Johney Frame is here tomorrow.  I discussed tentatively with Dr. Graciela Husbands who advised we discontinue Tikosyn. Will continue increased dose of Metoprolol for now (just increased this AM). EP will see the patient in the morning. The nurse and patient were made aware of the plan.   I had initially started a discharge summary which can be found under incomplete. The discharging PA can use this if they want when eventually discharging the patient.  I had made f/u appt in 1 week with me for BMET/Mg/EKG but cancelled this appointment after I learned he was not going home today. The appropriate f/u appt will need to be made at discharge.  Dayna Dunn PA-C

## 2014-04-30 NOTE — H&P (Signed)
     INTERVAL PROCEDURE H&P  History and Physical Interval Note:  04/30/2014 3:09 PM  [name] [name] has presented today for their planned procedure. The various methods of treatment have been discussed with the patient and family. After consideration of risks, benefits and other options for treatment, the patient has consented to the procedure.  The patients' outpatient history has been reviewed, patient examined, and no change in status from most recent office note within the past 30 days. I have reviewed the patients' chart and labs and will proceed as planned. Questions were answered to the patient's satisfaction.   Chrystie Nose, MD, [facility] Attending Cardiologist [facility]  Coti Burd C 04/30/2014, 3:09 PM

## 2014-04-30 NOTE — CV Procedure (Signed)
    CARDIOVERSION NOTE  Procedure: Electrical Cardioversion Indications:  Atrial Fibrillation  Procedure Details:  Consent: Risks of procedure as well as the alternatives and risks of each were explained to the (patient/caregiver).  Consent for procedure obtained.  Time Out: Verified patient identification, verified procedure, site/side was marked, verified correct patient position, special equipment/implants available, medications/allergies/relevent history reviewed, required imaging and test results available.  Performed  Patient placed on cardiac monitor, pulse oximetry, supplemental oxygen as necessary.  Sedation given: Propofol per anethesia Pacer pads placed anterior and posterior chest.  Cardioverted 3 time(s).  Cardioverted at 150J, 200J and 200J biphasic.  Impression: Findings: Post procedure EKG shows: Atrial Fibrillation Complications: None Patient did tolerate procedure well.  Plan: 1. Unsuccessful DCCV for a-fib with RVR on tikosyn. 2. May need additional anti-adrenergics or EP evaluation for possible catheter ablation.  Time Spent Directly with the Patient:  30 minutes   Chrystie Nose, MD, [facility] Attending Cardiologist [facility]  Stefany Starace C 04/30/2014, 3:51 PM

## 2014-04-30 NOTE — Anesthesia Procedure Notes (Signed)
Date/Time: 04/30/2014 3:33 PM Performed by: Lovie Chol Pre-anesthesia Checklist: Patient identified, Emergency Drugs available, Patient being monitored, Timeout performed and Suction available Patient Re-evaluated:Patient Re-evaluated prior to inductionOxygen Delivery Method: Ambu bag Preoxygenation: Pre-oxygenation with 100% oxygen Intubation Type: IV induction Ventilation: Mask ventilation without difficulty

## 2014-04-30 NOTE — Transfer of Care (Signed)
Immediate Anesthesia Transfer of Care Note  Patient: [name] [name]  Procedure(s) Performed: Procedure(s): CARDIOVERSION (N/A)  Patient Location: Endoscopy Unit  Anesthesia Type:General  Level of Consciousness: sedated and patient cooperative  Airway & Oxygen Therapy: Patient Spontanous Breathing and Patient connected to nasal cannula oxygen  Post-op Assessment: Report given to PACU RN and Post -op Vital signs reviewed and stable  Post vital signs: Reviewed  Complications: No apparent anesthesia complications

## 2014-05-01 ENCOUNTER — Encounter (HOSPITAL_COMMUNITY): Payer: Self-pay | Admitting: *Deleted

## 2014-05-01 LAB — BASIC METABOLIC PANEL
Anion gap: 15 (ref 5–15)
BUN: 21 mg/dL (ref 6–23)
CALCIUM: 9 mg/dL (ref 8.4–10.5)
CO2: 24 mEq/L (ref 19–32)
Chloride: 102 mEq/L (ref 96–112)
Creatinine, Ser: 0.98 mg/dL (ref 0.50–1.35)
GFR calc Af Amer: >90 mL/min (ref >90)
GFR calc non Af Amer: >90 mL/min (ref >90)
Glucose, Bld: 88 mg/dL (ref 70–99)
Potassium: 4.3 mEq/L (ref 3.7–5.3)
Sodium: 141 mEq/L (ref 137–147)

## 2014-05-01 LAB — MAGNESIUM: Magnesium: 2.1 mg/dL (ref 1.5–2.5)

## 2014-05-01 MED ORDER — METOPROLOL SUCCINATE ER 100 MG PO TB24: 100.0000 mg | ORAL_TABLET | Freq: Every day | ORAL | Status: DC

## 2014-05-01 MED ORDER — AMIODARONE HCL 200 MG PO TABS: 400.0000 mg | ORAL_TABLET | Freq: Two times a day (BID) | ORAL | Status: DC

## 2014-05-01 NOTE — Discharge Summary (Signed)
CARDIOLOGY DISCHARGE SUMMARY   Patient ID: Rope Brietzke MRN: [id number] DOB/AGE: 1960-10-04 53 y.o.  Admit date: 04/27/2014 Discharge date: 05/01/2014  PCP: Thane Edu, MD Primary Cardiologist: Dr. Antoine Poche  Primary Discharge Diagnosis:  Atrial fibrillation Secondary Discharge Diagnosis:  Chronic anticoagulation NICM, EF 30-35% by echo 03/2014  Consults: Dr. Johney Frame  Procedures: DCCV  Hospital Course: Evin Chaddock is a 53 y.o. male with no history of CAD. He has a history of persistent atrial fibrillation, anticoagulated with Apixaban, and GERD. He was seen by Dr. Antoine Poche in the office and admitted 10/04 for Tikosyn loading.    Labs were completed and were appropriate for Tikosyn. His QTC was 400 ms. His Tikosyn dose calculated out to 500 mcg twice a day.  He had been on Toprol-XL 50 mg daily for rate control. In the hospital, his rate was frequently elevated and this was increased to 100 mg daily. He tolerated the Tikosyn well. His potassium and magnesium levels were followed on daily basis his QTC was checked per the protocol and did not prolong. He was having no presyncope or syncope. He was otherwise tolerating current medical therapy well.  On 10/07, cardioversion was performed with the assistance of anesthesia. He had biphasic cardioversion 3 times at 150 J, 200 J and 250 J. He tolerated the procedures well but remained in atrial fibrillation. An EP consult was called.  On 10/08, he was seen by Dr. Johney Frame and all data were reviewed. He was felt to have tachycardia mediated cardiomyopathy. Because he fails Tikosyn, and because of his left ventricular dysfunction, the only antiarrhythmic option was amiodarone. Therapeutic strategies were discussed in detail with the patient including the risks, benefits, and alternatives to EP study with possible ablation. The decision was made to proceed with atrial fibrillation ablation as an outpatient. In the meantime,  for rate control, he is to start on amiodarone at 400 mg twice a day after a 48 hour washout period from the Tikosyn.   On 10/8, Mr. Schlimgen has improved rate control with the increased dose of beta blocker. He is agreeable to starting amiodarone, which will be started on 10/09. He will be scheduled for a follow up appointment and atrial fibrillation ablation as an outpatient. No further inpatient workup is indicated and he is considered stable for discharge, to follow up as an outpatient.  Labs:   Lab Results  Component Value Date   WBC 8.1 04/27/2014   HGB 14.9 04/27/2014   HCT 41.6 04/27/2014   MCV 92.2 04/27/2014   PLT 239 04/27/2014    Recent Labs Lab 04/27/14 1814  05/01/14 0320  NA 139  < > 141  K 4.2  < > 4.3  CL 101  < > 102  CO2 27  < > 24  BUN 20  < > 21  CREATININE 1.02  < > 0.98  CALCIUM 9.2  < > 9.0  PROT 7.0  --   --   BILITOT 0.4  --   --   ALKPHOS 70  --   --   ALT 24  --   --   AST 16  --   --   GLUCOSE 93  < > 88  < > = values in this interval not displayed.   EKG: 04/30/2014 Atrial fibrillation Vent. rate 101 BPM PR interval * ms QRS duration 94 ms QT/QTc 366/474 ms P-R-T axes * 66 4  FOLLOW UP PLANS AND APPOINTMENTS No Known Allergies   Medication List  amiodarone 200 MG tablet  Commonly known as:  PACERONE  Take 2 tablets (400 mg total) by mouth 2 (two) times daily.     ELIQUIS 5 MG Tabs tablet  Generic drug:  apixaban  Take 5 mg by mouth 2 (two) times daily.     Fish Oil 1000 MG Caps  Take 1,000 mg by mouth daily.     metoprolol succinate 100 MG 24 hr tablet  Commonly known as:  TOPROL-XL  Take 1 tablet (100 mg total) by mouth daily.     OVER THE COUNTER MEDICATION  Take 2 tablets by mouth every morning. "BLUE GREEN ALGAE"     ramipril 5 MG capsule  Commonly known as:  ALTACE  Take 5 mg by mouth daily.     ranitidine 150 MG tablet  Commonly known as:  ZANTAC  Take 150 mg by mouth daily as needed for heartburn.      spironolactone 25 MG tablet  Commonly known as:  ALDACTONE  Take 12.5 mg by mouth daily.     VITAMIN B-12 PO  Take 1 tablet by mouth daily with breakfast.     vitamin C 500 MG tablet  Commonly known as:  ASCORBIC ACID  Take 1,000 mg by mouth daily.     VITAMIN D PO  Take 2-4 tablets by mouth daily with breakfast.        Discharge Instructions   Diet - low sodium heart healthy    Complete by:  As directed      Increase activity slowly    Complete by:  As directed           Follow-up Information   Follow up with Hillis RangeAllred, Letti Towell, MD. (Ablation and followup appointment to be scheduled by the office, they will call you.)    Specialty:  Cardiology   Contact information:   [address] [place] [address] [phone]       Follow up with Rollene RotundaJames Hochrein, MD. (As scheduled)    Specialty:  Cardiology   Contact information:   [address] [place] [address] [phone]       BRING ALL MEDICATIONS WITH YOU TO FOLLOW UP APPOINTMENTS  Time spent with patient to include physician time: 39 min Signed: Theodore Demarkhonda Barrett, PA-C 05/01/2014, 3:30 PM Co-Sign MD  Hillis RangeJames Aftin Lye MD

## 2014-05-01 NOTE — Discharge Instructions (Signed)
DO NOT START AMIODARONE TILL FRIDAY, 05/02/2014.

## 2014-05-01 NOTE — Consult Note (Addendum)
ELECTROPHYSIOLOGY CONSULT NOTE    Patient ID: [name] [name] MRN: [id number], DOB/AGE: 55/07/1960 55 y.o.  Admit date: 04/27/2014 Date of Consult: 05-01-2014  Primary Physician: Thane EduKOEHLER,ROBERT [name], MD Primary Cardiologist: Hochrein  Reason for Consultation: atrial fibrillation  HPI:  [name] [name] is a 53 y.o. male with a past medical history significant for GERD and persistent atrial fibrillation.  He was first diagnosed with atrial fibrillation in April of 2014 in the setting of a viral illness and underwent cardioversion at that time.  Echocardiogram at that time demonstrated EF 45%.  He felt that he had maintained SR until late August.  He was seen by Dr Antoine PocheHochrein 04-15-14 at which time he was found to be in recurrent atrial fibrillation with elevated ventricular rates.  Repeat echocardiogram demonstrated EF 30-35%.  He was admitted for Tikosyn loading on 04-27-2014 and underwent cardioversion 04-30-2014 with ERAF.  EP has been asked to evaluate for treatment options.   He does not snore.  He drinks 1-3 beers per night.  He has eliminated caffeine from his diet with no appreciable benefit. He is adopted and is unsure of family history.   While in AF, he has symptoms of fatigue exercise intolerance.   Echo 04-15-14 demonstrated EF 30-35%, severe global hypokinesis, grade 1 diastolic dysfunction, LA 26. Myoview 11-2012 demonstrated no ischemia.   CHADS2VASC is 0 - he is anticoagulated with Apixaban.   He denies chest pain, shortness of breath, LE edema, fevers, chills, nausea, vomiting.  ROS is otherwise negative.   Past Medical History  Diagnosis Date  . GERD (gastroesophageal reflux disease)   . PAF (paroxysmal atrial fibrillation)     a. Dx 10/2012 s/p DCCV. b. Recurrence noted 03/2014.  Marland Kitchen. Adopted   . Anxiety   . Depression   . LV dysfunction     a. EF  45% during 10/2012 admission with no definite ischemia by nuc at that time. b. EF 30-35% by echo 03/2014.     Surgical  History:  Past Surgical History  Procedure Laterality Date  . Tee without cardioversion N/A 11/02/2012    Procedure: TRANSESOPHAGEAL ECHOCARDIOGRAM (TEE);  Surgeon: Ricki RodriguezAjay S Kadakia, MD;  [facility];  Service: Cardiovascular;  Laterality: N/A;  . Cardioversion N/A 11/02/2012    Procedure: CARDIOVERSION;  Surgeon: Ricki RodriguezAjay S Kadakia, MD;  [facility];  Service: Cardiovascular;  Laterality: N/A;  . Cardioversion  04-30-2014    with ERAF     Prescriptions prior to admission  Medication Sig Dispense Refill  . apixaban (ELIQUIS) 5 MG TABS tablet Take 5 mg by mouth 2 (two) times daily.      . Cholecalciferol (VITAMIN D PO) Take 2-4 tablets by mouth daily with breakfast.       . Cyanocobalamin (VITAMIN B-12 PO) Take 1 tablet by mouth daily with breakfast.      . metoprolol succinate (TOPROL-XL) 50 MG 24 hr tablet Take 1 tablet (50 mg total) by mouth daily.  30 tablet  3  . Omega-3 Fatty Acids (FISH OIL) 1000 MG CAPS Take 1,000 mg by mouth daily.      Marland Kitchen. OVER THE COUNTER MEDICATION Take 2 tablets by mouth every morning. "BLUE GREEN ALGAE"      . ramipril (ALTACE) 5 MG capsule Take 5 mg by mouth daily.      . ranitidine (ZANTAC) 150 MG tablet Take 150 mg by mouth daily as needed for heartburn.      . spironolactone (ALDACTONE) 25 MG tablet Take 12.5 mg by  mouth daily.      . vitamin C (ASCORBIC ACID) 500 MG tablet Take 1,000 mg by mouth daily.         Inpatient Medications:  . apixaban  5 mg Oral BID  . metoprolol succinate  100 mg Oral Daily  . ramipril  5 mg Oral Daily  . sodium chloride  3 mL Intravenous Q12H  . spironolactone  12.5 mg Oral Daily  . vitamin C  1,000 mg Oral Daily    Allergies: No Known Allergies  History   Social History  . Marital Status: Divorced    Spouse Name: N/A    Number of Children: 1  . Years of Education: N/A   Occupational History  .     Social History Main Topics  . Smoking status: Never Smoker   . Smokeless tobacco: Never Used  .  Alcohol Use: 4.2 oz/week    7 Cans of beer per week     Comment: 11/01/2012 "1 beer/day, average"  . Drug Use: Yes    Special: Marijuana     Comment: 11/01/2012 "last marijuana ~ 1 yr ago"  . Sexual Activity: Not Currently   Other Topics Concern  . Not on file   Social History Narrative   Lives alone.       Family History  Problem Relation Age of Onset  . Adopted: Yes    BP 107/70  Pulse 93  Temp(Src) 97.4 F (36.3 C) (Axillary)  Resp 18  Ht 6' (1.829 m)  Wt 164 lb 8 oz (74.617 kg)  BMI 22.31 kg/m2  SpO2 98%  Physical Exam: Filed Vitals:   04/30/14 1555 04/30/14 1658 04/30/14 1950 05/01/14 0538  BP: 110/87 115/67 128/82 107/70  Pulse: 115 109 116 93  Temp:  97.7 F (36.5 C) 97.9 F (36.6 C) 97.4 F (36.3 C)  TempSrc:  Oral Oral Axillary  Resp: 23 19 20 18   Height:      Weight:    164 lb 8 oz (74.617 kg)  SpO2: 100% 100% 100% 98%    GEN- The patient is well appearing, alert and oriented x 3 today.   Head- normocephalic, atraumatic Eyes-  Sclera clear, conjunctiva pink Ears- hearing intact Oropharynx- clear Neck- supple, Lungs- Clear to ausculation bilaterally, normal work of breathing Heart- tachycardic irregular  rhythm, no murmurs, rubs or gallops, PMI not laterally displaced GI- soft, NT, ND, + BS Extremities- no clubbing, cyanosis, or edema, groin is without hematoma/ bruit MS- no significant deformity or atrophy Skin- no rash or lesion Psych- euthymic mood, full affect Neuro- strength and sensation are intact  Labs:   Lab Results  Component Value Date   WBC 8.1 04/27/2014   HGB 14.9 04/27/2014   HCT 41.6 04/27/2014   MCV 92.2 04/27/2014   PLT 239 04/27/2014    Recent Labs Lab 04/27/14 1814  05/01/14 0320  NA 139  < > 141  K 4.2  < > 4.3  CL 101  < > 102  CO2 27  < > 24  BUN 20  < > 21  CREATININE 1.02  < > 0.98  CALCIUM 9.2  < > 9.0  PROT 7.0  --   --   BILITOT 0.4  --   --   ALKPHOS 70  --   --   ALT 24  --   --   AST 16  --   --     GLUCOSE 93  < > 88  < > = values  in this interval not displayed.  ZOX:[id number] fibrillation, rate 101, PVC's  TELEMETRY: atrial fibrillation, ventricular rates 90-130's  A/P  1. Persistent atrial fibrillation The patient has symptomatic recurrent atrial fibrillation with difficult to control V rates and presumed tachycardia mediated cardiomyopathy.  He has failed medical therapy with tikosyn.  His only other AAD option is amiodarone.  Therapeutic strategies for afib including medicine and ablation were discussed in detail with the patient today. Risk, benefits, and alternatives to EP study and radiofrequency ablation for afib were also discussed in detail today. These risks include but are not limited to stroke, bleeding, vascular damage, tamponade, perforation, damage to the esophagus, lungs, and other structures, pulmonary vein stenosis, worsening renal function, radiation exposure, and death. The patient understands these risk and wishes to proceed.  I will therefore schedule elective afib ablation within the next few weeks. In the interim, I will start amiodarone for rate control.  Risks, benefits, and alternatives to amiodarone were discussed at length with the patient including but not limited to pulmonary, liver, and thyroid toxicity.  He understands risks and wishes to start the medicine.  He is clear also that he would like to be discharged at this time. I would recommend starting amiodarone 400mg  BID 48 hours from last dose of tikosyn (tomorrow am). Continue eliquis Follow-up with me for further discussion next week.  I will tentatively plan to perform his ablation on 05/13/14.  He will need a TEE within 24 hours of the procedure.  My office will arrange.  2. Nonischemic CM Presumed to be tachycardia mediated Continue metoprolol 100mg  daily and add amiodarone for rate control Further workup and management per Dr Antoine Poche  DC to home today.

## 2014-05-02 ENCOUNTER — Encounter (HOSPITAL_COMMUNITY): Payer: Self-pay | Admitting: Pharmacy Technician

## 2014-05-02 ENCOUNTER — Other Ambulatory Visit: Payer: Self-pay | Admitting: *Deleted

## 2014-05-02 ENCOUNTER — Telehealth: Payer: Self-pay | Admitting: *Deleted

## 2014-05-02 DIAGNOSIS — I4819 Other persistent atrial fibrillation: Secondary | ICD-10-CM

## 2014-05-02 NOTE — Telephone Encounter (Signed)
Spoke with patient after discussing with Dr Johney Frame.  Had an opening for 10/6 from a cancellation.  He is going to have his TEE on Mon and afib ablation on Tues.  He is aware to arrive at 6:30am for TEE NPO after midnight okay to take am medications and arrive 10/6 at 5:30am  NPO after midnight do not take any medications the morning of.

## 2014-05-05 ENCOUNTER — Ambulatory Visit (HOSPITAL_COMMUNITY)
Admission: RE | Admit: 2014-05-05 | Discharge: 2014-05-05 | Disposition: A | Discharge status: Home / Self Care | Payer: No Typology Code available for payment source | Source: Ambulatory Visit | Attending: Cardiology | Admitting: Cardiology

## 2014-05-05 ENCOUNTER — Encounter (HOSPITAL_COMMUNITY)
Admission: RE | Disposition: A | Discharge status: Home / Self Care | Payer: Self-pay | Source: Ambulatory Visit | Attending: Cardiology

## 2014-05-05 ENCOUNTER — Encounter (HOSPITAL_COMMUNITY): Payer: Self-pay | Admitting: Gastroenterology

## 2014-05-05 ENCOUNTER — Encounter: Payer: No Typology Code available for payment source | Admitting: Internal Medicine

## 2014-05-05 DIAGNOSIS — F419 Anxiety disorder, unspecified: Secondary | ICD-10-CM | POA: Diagnosis not present

## 2014-05-05 DIAGNOSIS — I481 Persistent atrial fibrillation: Secondary | ICD-10-CM | POA: Insufficient documentation

## 2014-05-05 DIAGNOSIS — K219 Gastro-esophageal reflux disease without esophagitis: Secondary | ICD-10-CM | POA: Insufficient documentation

## 2014-05-05 DIAGNOSIS — F329 Major depressive disorder, single episode, unspecified: Secondary | ICD-10-CM | POA: Insufficient documentation

## 2014-05-05 DIAGNOSIS — I4819 Other persistent atrial fibrillation: Secondary | ICD-10-CM

## 2014-05-05 DIAGNOSIS — I341 Nonrheumatic mitral (valve) prolapse: Secondary | ICD-10-CM

## 2014-05-05 HISTORY — PX: TEE WITHOUT CARDIOVERSION: SHX5443

## 2014-05-05 SURGERY — ECHOCARDIOGRAM, TRANSESOPHAGEAL
Anesthesia: Moderate Sedation

## 2014-05-05 MED ORDER — DIPHENHYDRAMINE HCL 50 MG/ML IJ SOLN
INTRAMUSCULAR | Status: AC
  Filled 2014-05-05: qty 1

## 2014-05-05 MED ORDER — MIDAZOLAM HCL 5 MG/ML IJ SOLN
INTRAMUSCULAR | Status: AC
  Filled 2014-05-05: qty 2

## 2014-05-05 MED ORDER — BUTAMBEN-TETRACAINE-BENZOCAINE 2-2-14 % EX AERO
INHALATION_SPRAY | CUTANEOUS | Status: DC | PRN
  Administered 2014-05-05: 2 via TOPICAL

## 2014-05-05 MED ORDER — FENTANYL CITRATE 0.05 MG/ML IJ SOLN
INTRAMUSCULAR | Status: DC | PRN
  Administered 2014-05-05 (×2): 25 ug via INTRAVENOUS

## 2014-05-05 MED ORDER — FENTANYL CITRATE 0.05 MG/ML IJ SOLN
INTRAMUSCULAR | Status: AC
  Filled 2014-05-05: qty 4

## 2014-05-05 MED ORDER — SODIUM CHLORIDE 0.9 % IV SOLN
INTRAVENOUS | Status: DC
  Administered 2014-05-05: 500 mL via INTRAVENOUS

## 2014-05-05 MED ORDER — MIDAZOLAM HCL 5 MG/5ML IJ SOLN
INTRAMUSCULAR | Status: DC | PRN
  Administered 2014-05-05 (×2): 2 mg via INTRAVENOUS

## 2014-05-05 NOTE — Discharge Instructions (Signed)
Transesophageal Echocardiogram °Transesophageal echocardiography (TEE) is a special type of test that produces images of the heart by using sound waves (echocardiogram). This type of echocardiography can obtain better images of the heart than standard echocardiography. TEE is done by passing a flexible tube down the esophagus. The heart is located in front of the esophagus. Because the heart and esophagus are close to one another, your health care provider can take very clear, detailed pictures of the heart via ultrasound waves. °TEE may be done: °· If your health care provider needs more information based on standard echocardiography findings. °· If you had a stroke. This might have happened because a clot formed in your heart. TEE can visualize different areas of the heart and check for clots. °· To check valve anatomy and function. °· To check for infection on the inside of your heart (endocarditis). °· To evaluate the dividing wall (septum) of the heart and presence of a hole that did not close after birth (patent foramen ovale or atrial septal defect). °· To help diagnose a tear in the wall of the aorta (aortic dissection). °· During cardiac valve surgery. This allows the surgeon to assess the valve repair before closing the chest. °· During a variety of other cardiac procedures to guide positioning of catheters. °· Sometimes before a cardioversion, which is a shock to convert heart rhythm back to normal. °LET YOUR HEALTH CARE PROVIDER KNOW ABOUT:  °· Any allergies you have. °· All medicines you are taking, including vitamins, herbs, eye drops, creams, and over-the-counter medicines. °· Previous problems you or members of your family have had with the use of anesthetics. °· Any blood disorders you have. °· Previous surgeries you have had. °· Medical conditions you have. °· Swallowing difficulties. °· An esophageal obstruction. °RISKS AND COMPLICATIONS  °Generally, TEE is a safe procedure. However, as with any  procedure, complications can occur. Possible complications include an esophageal tear (rupture). °BEFORE THE PROCEDURE  °· Do not eat or drink for 6 hours before the procedure or as directed by your health care provider. °· Arrange for someone to drive you home after the procedure. Do not drive yourself home. During the procedure, you will be given medicines that can continue to make you feel drowsy and can impair your reflexes. °· An IV access tube will be started in the arm. °PROCEDURE  °· A medicine to help you relax (sedative) will be given through the IV access tube. °· A medicine may be sprayed or gargled to numb the back of the throat. °· Your blood pressure, heart rate, and breathing (vital signs) will be monitored during the procedure. °· The TEE probe is a long, flexible tube. The tip of the probe is placed into the back of the mouth, and you will be asked to swallow. This helps to pass the tip of the probe into the esophagus. Once the tip of the probe is in the correct area, your health care provider can take pictures of the heart. °· TEE is usually not a painful procedure. You may feel the probe press against the back of the throat. The probe does not enter the trachea and does not affect your breathing. °AFTER THE PROCEDURE  °· You will be in bed, resting, until you have fully returned to consciousness. °· When you first awaken, your throat may feel slightly sore and will probably still feel numb. This will improve slowly over time. °· You will not be allowed to eat or drink until it   is clear that the numbness has improved. °· Once you have been able to drink, urinate, and sit on the edge of the bed without feeling sick to your stomach (nausea) or dizzy, you may be cleared to go home. °· You should have a friend or family member with you for the next 24 hours after your procedure. °Document Released: 10/01/2002 Document Revised: 07/16/2013 Document Reviewed: 01/10/2013 °[employer]® Patient Information  ©2015 [employer]. This information is not intended to replace advice given to you by your health care provider. Make sure you discuss any questions you have with your health care provider. ° °

## 2014-05-05 NOTE — H&P (Signed)
Patient Information    Patient Name Sex DOB SSN    [name] [name], [name] [name] Male 1960/07/30 [id number]       Consult Note by [name], MD at 05/01/2014  6:26 AM    Author: [name], MD Service: Electrophysiology Author Type: Physician    Filed: 05/01/2014  8:11 AM Note Time: 05/01/2014  6:26 AM Status: Addendum    Editor: [name], MD (Physician)        Related Notes: Original Note by [name], MD (Physician) filed at 05/01/2014  8:10 AM        ELECTROPHYSIOLOGY CONSULT NOTE        Patient ID: [name] [name] MRN: [id number], DOB/AGE: 02-08-1961 53 y.o.   Admit date: 04/27/2014 Date of Consult: 05-01-2014   Primary Physician: Thane Edu, MD Primary Cardiologist: Hochrein   Reason for Consultation: atrial fibrillation   HPI:  [name] [name] is a 53 y.o. male with a past medical history significant for GERD and persistent atrial fibrillation.  He was first diagnosed with atrial fibrillation in April of 2014 in the setting of a viral illness and underwent cardioversion at that time.  Echocardiogram at that time demonstrated EF 45%.  He felt that he had maintained SR until late August.  He was seen by Dr Antoine Poche 04-15-14 at which time he was found to be in recurrent atrial fibrillation with elevated ventricular rates.  Repeat echocardiogram demonstrated EF 30-35%.  He was admitted for Tikosyn loading on 04-27-2014 and underwent cardioversion 04-30-2014 with ERAF.  EP has been asked to evaluate for treatment options.    He does not snore.  He drinks 1-3 beers per night.  He has eliminated caffeine from his diet with no appreciable benefit. He is adopted and is unsure of family history.    While in AF, he has symptoms of fatigue exercise intolerance.    Echo 04-15-14 demonstrated EF 30-35%, severe global hypokinesis, grade 1 diastolic dysfunction, LA 26. Myoview 11-2012 demonstrated no ischemia.    CHADS2VASC is 0 - he is anticoagulated with Apixaban.    He  denies chest pain, shortness of breath, LE edema, fevers, chills, nausea, vomiting.  ROS is otherwise negative.     Past Medical History   Diagnosis  Date   .  GERD (gastroesophageal reflux disease)     .  PAF (paroxysmal atrial fibrillation)         a. Dx 10/2012 s/p DCCV. b. Recurrence noted 03/2014.   Marland Kitchen  Adopted     .  Anxiety     .  Depression     .  LV dysfunction         a. EF  45% during 10/2012 admission with no definite ischemia by nuc at that time. b. EF 30-35% by echo 03/2014.        Surgical History:  Past Surgical History   Procedure  Laterality  Date   .  Tee without cardioversion  N/A  11/02/2012       Procedure: TRANSESOPHAGEAL ECHOCARDIOGRAM (TEE);  Surgeon: Ricki Rodriguez, MD;  Location: [facility] ENDOSCOPY;  Service: Cardiovascular;  Laterality: N/A;   .  Cardioversion  N/A  11/02/2012       Procedure: CARDIOVERSION;  Surgeon: Ricki Rodriguez, MD;  Location: [facility] ENDOSCOPY;  Service: Cardiovascular;  Laterality: N/A;   .  Cardioversion    04-30-2014       with ERAF         Prescriptions prior to admission   Medication  Sig  Dispense  Refill   .  apixaban (ELIQUIS) 5 MG TABS tablet  Take 5 mg by mouth 2 (two) times daily.         .  Cholecalciferol (VITAMIN D PO)  Take 2-4 tablets by mouth daily with breakfast.          .  Cyanocobalamin (VITAMIN B-12 PO)  Take 1 tablet by mouth daily with breakfast.         .  metoprolol succinate (TOPROL-XL) 50 MG 24 hr tablet  Take 1 tablet (50 mg total) by mouth daily.   30 tablet   3   .  Omega-3 Fatty Acids (FISH OIL) 1000 MG CAPS  Take 1,000 mg by mouth daily.         Marland Kitchen  OVER THE COUNTER MEDICATION  Take 2 tablets by mouth every morning. "BLUE GREEN ALGAE"         .  ramipril (ALTACE) 5 MG capsule  Take 5 mg by mouth daily.         .  ranitidine (ZANTAC) 150 MG tablet  Take 150 mg by mouth daily as needed for heartburn.         .  spironolactone (ALDACTONE) 25 MG tablet  Take 12.5 mg by mouth daily.         .  vitamin C (ASCORBIC ACID)  500 MG tablet  Take 1,000 mg by mouth daily.               Inpatient Medications:  .  apixaban   5 mg  Oral  BID   .  metoprolol succinate   100 mg  Oral  Daily   .  ramipril   5 mg  Oral  Daily   .  sodium chloride   3 mL  Intravenous  Q12H   .  spironolactone   12.5 mg  Oral  Daily   .  vitamin C   1,000 mg  Oral  Daily        Allergies: No Known Allergies    History       Social History   .  Marital Status:  Divorced       Spouse Name:  N/A       Number of Children:  1   .  Years of Education:  N/A       Occupational History   .           Social History Main Topics   .  Smoking status:  Never Smoker    .  Smokeless tobacco:  Never Used   .  Alcohol Use:  4.2 oz/week       7 Cans of beer per week         Comment: 11/01/2012 "1 beer/day, average"   .  Drug Use:  Yes       Special:  Marijuana         Comment: 11/01/2012 "last marijuana ~ 1 yr ago"   .  Sexual Activity:  Not Currently       Other Topics  Concern   .  Not on file       Social History Narrative     Lives alone.           Family History   Problem  Relation  Age of Onset   .  Adopted: Yes      BP 107/70  Pulse 93  Temp(Src) 97.4 F (36.3 C) (Axillary)  Resp 18  Ht 6' (1.829 m)  Wt 164 lb 8 oz (74.617 kg)  BMI 22.31 kg/m2  SpO2 98%   Physical Exam: Filed Vitals:     04/30/14 1555  04/30/14 1658  04/30/14 1950  05/01/14 0538   BP:  110/87  115/67  128/82  107/70   Pulse:  115  109  116  93   Temp:    97.7 F (36.5 C)  97.9 F (36.6 C)  97.4 F (36.3 C)   TempSrc:    Oral  Oral  Axillary   Resp:  23  19  20  18    Height:           Weight:        164 lb 8 oz (74.617 kg)   SpO2:  100%  100%  100%  98%        GEN- The patient is well appearing, alert and oriented x 3 today.    Head- normocephalic, atraumatic Eyes-  Sclera clear, conjunctiva pink Ears- hearing intact Oropharynx- clear Neck- supple, Lungs- Clear to ausculation bilaterally, normal work of  breathing Heart- tachycardic irregular  rhythm, no murmurs, rubs or gallops, PMI not laterally displaced GI- soft, NT, ND, + BS Extremities- no clubbing, cyanosis, or edema, groin is without hematoma/ bruit MS- no significant deformity or atrophy Skin- no rash or lesion Psych- euthymic mood, full affect Neuro- strength and sensation are intact   Labs:    Lab Results   Component  Value  Date     WBC  8.1  04/27/2014     HGB  14.9  04/27/2014     HCT  41.6  04/27/2014     MCV  92.2  04/27/2014     PLT  239  04/27/2014       Recent Labs Lab  04/27/14 1814    05/01/14 0320   NA  139   < >  141   K  4.2   < >  4.3   CL  101   < >  102   CO2  27   < >  24   BUN  20   < >  21   CREATININE  1.02   < >  0.98   CALCIUM  9.2   < >  9.0   PROT  7.0   --    --    BILITOT  0.4   --    --    ALKPHOS  70   --    --    ALT  24   --    --    AST  16   --    --    GLUCOSE  93   < >  88    < > = values in this interval not displayed.   PJA:[id number]:atrial fibrillation, rate 101, PVC's   TELEMETRY: atrial fibrillation, ventricular rates 90-130's   A/P   1. Persistent atrial fibrillation The patient has symptomatic recurrent atrial fibrillation with difficult to control V rates and presumed tachycardia mediated cardiomyopathy.  He has failed medical therapy with tikosyn.  His only other AAD option is amiodarone.  Therapeutic strategies for afib including medicine and ablation were discussed in detail with the patient today. Risk, benefits, and alternatives to EP study and radiofrequency ablation for afib were also discussed in detail today. These risks include but are not limited to stroke, bleeding, vascular damage, tamponade, perforation, damage to the esophagus, lungs, and other structures,  pulmonary vein stenosis, worsening renal function, radiation exposure, and death. The patient understands these risk and wishes to proceed.  I will therefore schedule elective afib ablation within the next few  weeks. In the interim, I will start amiodarone for rate control.  Risks, benefits, and alternatives to amiodarone were discussed at length with the patient including but not limited to pulmonary, liver, and thyroid toxicity.  He understands risks and wishes to start the medicine.  He is clear also that he would like to be discharged at this time. I would recommend starting amiodarone 400mg  BID 48 hours from last dose of tikosyn (tomorrow am). Continue eliquis Follow-up with me for further discussion next week.  I will tentatively plan to perform his ablation on 05/13/14.  He will need a TEE within 24 hours of the procedure.  My office will arrange.   2. Nonischemic CM Presumed to be tachycardia mediated Continue metoprolol 100mg  daily and add amiodarone for rate control Further workup and management per Dr Antoine Poche   DC to home today.    For TEE prior to atrial fibrillation ablation. No changes. Olga Millers

## 2014-05-05 NOTE — CV Procedure (Signed)
See full TEE report in [facility]; mild global reduction in LV function (EF 50); no LAA thrombus noted; mild MR; TV prolapse with trace TR. Olga Millers

## 2014-05-05 NOTE — Progress Notes (Signed)
  Echocardiogram Echocardiogram Transesophageal has been performed.  [name] [name] 05/05/2014, 8:33 AM

## 2014-05-05 NOTE — Interval H&P Note (Signed)
History and Physical Interval Note:  05/05/2014 8:04 AM  Mackinnon Genova  has presented today for surgery, with the diagnosis of a fib  The various methods of treatment have been discussed with the patient and family. After consideration of risks, benefits and other options for treatment, the patient has consented to  Procedure(s): TRANSESOPHAGEAL ECHOCARDIOGRAM (TEE) (N/A) as a surgical intervention .  The patient's history has been reviewed, patient examined, no change in status, stable for surgery.  I have reviewed the patient's chart and labs.  Questions were answered to the patient's satisfaction.     Olga Millers

## 2014-05-06 ENCOUNTER — Encounter (HOSPITAL_COMMUNITY): Payer: No Typology Code available for payment source | Admitting: Certified Registered"

## 2014-05-06 ENCOUNTER — Encounter (HOSPITAL_COMMUNITY): Payer: Self-pay | Admitting: Cardiology

## 2014-05-06 ENCOUNTER — Ambulatory Visit (HOSPITAL_COMMUNITY)
Admission: RE | Admit: 2014-05-06 | Discharge: 2014-05-07 | Disposition: A | Discharge status: Home / Self Care | Payer: No Typology Code available for payment source | Source: Ambulatory Visit | Attending: Internal Medicine | Admitting: Internal Medicine

## 2014-05-06 ENCOUNTER — Encounter (HOSPITAL_COMMUNITY)
Admission: RE | Disposition: A | Discharge status: Home / Self Care | Payer: No Typology Code available for payment source | Source: Ambulatory Visit | Attending: Internal Medicine

## 2014-05-06 ENCOUNTER — Ambulatory Visit (HOSPITAL_COMMUNITY): Payer: No Typology Code available for payment source | Admitting: Certified Registered"

## 2014-05-06 DIAGNOSIS — I429 Cardiomyopathy, unspecified: Secondary | ICD-10-CM | POA: Diagnosis not present

## 2014-05-06 DIAGNOSIS — Z7901 Long term (current) use of anticoagulants: Secondary | ICD-10-CM

## 2014-05-06 DIAGNOSIS — K219 Gastro-esophageal reflux disease without esophagitis: Secondary | ICD-10-CM | POA: Insufficient documentation

## 2014-05-06 DIAGNOSIS — Z79899 Other long term (current) drug therapy: Secondary | ICD-10-CM | POA: Diagnosis not present

## 2014-05-06 DIAGNOSIS — I480 Paroxysmal atrial fibrillation: Secondary | ICD-10-CM

## 2014-05-06 DIAGNOSIS — I4891 Unspecified atrial fibrillation: Secondary | ICD-10-CM | POA: Diagnosis present

## 2014-05-06 DIAGNOSIS — I481 Persistent atrial fibrillation: Secondary | ICD-10-CM

## 2014-05-06 HISTORY — PX: ATRIAL FIBRILLATION ABLATION: SHX5456

## 2014-05-06 LAB — POCT ACTIVATED CLOTTING TIME
Activated Clotting Time: 259 seconds
Activated Clotting Time: 281 seconds
Activated Clotting Time: 275 seconds
Activated Clotting Time: 146 seconds

## 2014-05-06 LAB — MRSA PCR SCREENING: MRSA by PCR: NEGATIVE

## 2014-05-06 SURGERY — ATRIAL FIBRILLATION ABLATION
Anesthesia: Monitor Anesthesia Care

## 2014-05-06 MED ORDER — APIXABAN 5 MG PO TABS
5.0000 mg | ORAL_TABLET | Freq: Two times a day (BID) | ORAL | Status: DC
  Administered 2014-05-06 – 2014-05-07 (×2): 5 mg via ORAL
  Filled 2014-05-06 (×3): qty 1

## 2014-05-06 MED ORDER — HYDROCODONE-ACETAMINOPHEN 5-325 MG PO TABS
ORAL_TABLET | ORAL | Status: AC
  Filled 2014-05-06: qty 1

## 2014-05-06 MED ORDER — MIDAZOLAM HCL 5 MG/5ML IJ SOLN
INTRAMUSCULAR | Status: DC | PRN
  Administered 2014-05-06 (×2): 2 mg via INTRAVENOUS

## 2014-05-06 MED ORDER — ONDANSETRON HCL 4 MG/2ML IJ SOLN
INTRAMUSCULAR | Status: DC | PRN
  Administered 2014-05-06: 4 mg via INTRAVENOUS

## 2014-05-06 MED ORDER — HEPARIN SODIUM (PORCINE) 1000 UNIT/ML IJ SOLN
INTRAMUSCULAR | Status: AC
  Filled 2014-05-06: qty 1

## 2014-05-06 MED ORDER — FENTANYL CITRATE 0.05 MG/ML IJ SOLN
INTRAMUSCULAR | Status: DC | PRN
  Administered 2014-05-06 (×2): 25 ug via INTRAVENOUS
  Administered 2014-05-06: 50 ug via INTRAVENOUS

## 2014-05-06 MED ORDER — SODIUM CHLORIDE 0.9 % IV SOLN: 250.0000 mL | INTRAVENOUS | Status: DC | PRN

## 2014-05-06 MED ORDER — SODIUM CHLORIDE 0.9 % IV SOLN
INTRAVENOUS | Status: DC | PRN
  Administered 2014-05-06 (×2): via INTRAVENOUS

## 2014-05-06 MED ORDER — HYDROCODONE-ACETAMINOPHEN 5-325 MG PO TABS
1.0000 | ORAL_TABLET | ORAL | Status: DC | PRN
  Administered 2014-05-06 (×2): 1 via ORAL

## 2014-05-06 MED ORDER — BUPIVACAINE HCL (PF) 0.25 % IJ SOLN
INTRAMUSCULAR | Status: AC
  Filled 2014-05-06: qty 30

## 2014-05-06 MED ORDER — AMIODARONE HCL 200 MG PO TABS
200.0000 mg | ORAL_TABLET | Freq: Every day | ORAL | Status: DC
  Administered 2014-05-06 – 2014-05-07 (×2): 200 mg via ORAL
  Filled 2014-05-06 (×2): qty 1

## 2014-05-06 MED ORDER — PROTAMINE SULFATE 10 MG/ML IV SOLN
INTRAVENOUS | Status: DC | PRN
  Administered 2014-05-06 (×3): 10 mg via INTRAVENOUS

## 2014-05-06 MED ORDER — ONDANSETRON HCL 4 MG/2ML IJ SOLN: 4.0000 mg | Freq: Four times a day (QID) | INTRAMUSCULAR | Status: DC | PRN

## 2014-05-06 MED ORDER — HEPARIN SODIUM (PORCINE) 1000 UNIT/ML IJ SOLN
INTRAMUSCULAR | Status: DC | PRN
  Administered 2014-05-06: 12000 [IU] via INTRAVENOUS
  Administered 2014-05-06: 3000 [IU] via INTRAVENOUS
  Administered 2014-05-06: 2000 [IU] via INTRAVENOUS

## 2014-05-06 MED ORDER — SODIUM CHLORIDE 0.9 % IJ SOLN
3.0000 mL | Freq: Two times a day (BID) | INTRAMUSCULAR | Status: DC
  Administered 2014-05-06 – 2014-05-07 (×2): 3 mL via INTRAVENOUS

## 2014-05-06 MED ORDER — ARTIFICIAL TEARS OP OINT
TOPICAL_OINTMENT | OPHTHALMIC | Status: DC | PRN
  Administered 2014-05-06: 1 via OPHTHALMIC

## 2014-05-06 MED ORDER — FENTANYL CITRATE 0.05 MG/ML IJ SOLN
INTRAMUSCULAR | Status: AC
  Filled 2014-05-06: qty 2

## 2014-05-06 MED ORDER — LIDOCAINE HCL (CARDIAC) 20 MG/ML IV SOLN
INTRAVENOUS | Status: DC | PRN
  Administered 2014-05-06: 80 mg via INTRAVENOUS

## 2014-05-06 MED ORDER — FENTANYL CITRATE 0.05 MG/ML IJ SOLN
12.5000 ug | INTRAMUSCULAR | Status: DC | PRN
  Administered 2014-05-06: 12.5 ug via INTRAVENOUS

## 2014-05-06 MED ORDER — ACETAMINOPHEN 325 MG PO TABS: 650.0000 mg | ORAL_TABLET | ORAL | Status: DC | PRN

## 2014-05-06 MED ORDER — SODIUM CHLORIDE 0.9 % IJ SOLN: 3.0000 mL | INTRAMUSCULAR | Status: DC | PRN

## 2014-05-06 MED ORDER — PROPOFOL 10 MG/ML IV BOLUS
INTRAVENOUS | Status: DC | PRN
  Administered 2014-05-06: 200 mg via INTRAVENOUS
  Administered 2014-05-06: 30 mg via INTRAVENOUS

## 2014-05-06 NOTE — H&P (View-Only) (Signed)
 ELECTROPHYSIOLOGY CONSULT NOTE    Patient ID: [name] [name] MRN: [id number], DOB/AGE: 55/22/1962 55 y.o.  Admit date: 04/27/2014 Date of Consult: 05-01-2014  Primary Physician: KOEHLER,ROBERT [name], MD Primary Cardiologist: Hochrein  Reason for Consultation: atrial fibrillation  HPI:  [name] [name] is a 53 y.o. male with a past medical history significant for GERD and persistent atrial fibrillation.  He was first diagnosed with atrial fibrillation in April of 2014 in the setting of a viral illness and underwent cardioversion at that time.  Echocardiogram at that time demonstrated EF 45%.  He felt that he had maintained SR until late August.  He was seen by Dr Hochrein 04-15-14 at which time he was found to be in recurrent atrial fibrillation with elevated ventricular rates.  Repeat echocardiogram demonstrated EF 30-35%.  He was admitted for Tikosyn loading on 04-27-2014 and underwent cardioversion 04-30-2014 with ERAF.  EP has been asked to evaluate for treatment options.   He does not snore.  He drinks 1-3 beers per night.  He has eliminated caffeine from his diet with no appreciable benefit. He is adopted and is unsure of family history.   While in AF, he has symptoms of fatigue exercise intolerance.   Echo 04-15-14 demonstrated EF 30-35%, severe global hypokinesis, grade 1 diastolic dysfunction, LA 26. Myoview 11-2012 demonstrated no ischemia.   CHADS2VASC is 0 - he is anticoagulated with Apixaban.   He denies chest pain, shortness of breath, LE edema, fevers, chills, nausea, vomiting.  ROS is otherwise negative.   Past Medical History  Diagnosis Date  . GERD (gastroesophageal reflux disease)   . PAF (paroxysmal atrial fibrillation)     a. Dx 10/2012 s/p DCCV. b. Recurrence noted 03/2014.  . Adopted   . Anxiety   . Depression   . LV dysfunction     a. EF  45% during 10/2012 admission with no definite ischemia by nuc at that time. b. EF 30-35% by echo 03/2014.     Surgical  History:  Past Surgical History  Procedure Laterality Date  . Tee without cardioversion N/A 11/02/2012    Procedure: TRANSESOPHAGEAL ECHOCARDIOGRAM (TEE);  Surgeon: Ajay S Kadakia, MD;  Location: [facility];  Service: Cardiovascular;  Laterality: N/A;  . Cardioversion N/A 11/02/2012    Procedure: CARDIOVERSION;  Surgeon: Ajay S Kadakia, MD;  Location: [facility];  Service: Cardiovascular;  Laterality: N/A;  . Cardioversion  04-30-2014    with ERAF     Prescriptions prior to admission  Medication Sig Dispense Refill  . apixaban (ELIQUIS) 5 MG TABS tablet Take 5 mg by mouth 2 (two) times daily.      . Cholecalciferol (VITAMIN D PO) Take 2-4 tablets by mouth daily with breakfast.       . Cyanocobalamin (VITAMIN B-12 PO) Take 1 tablet by mouth daily with breakfast.      . metoprolol succinate (TOPROL-XL) 50 MG 24 hr tablet Take 1 tablet (50 mg total) by mouth daily.  30 tablet  3  . Omega-3 Fatty Acids (FISH OIL) 1000 MG CAPS Take 1,000 mg by mouth daily.      . OVER THE COUNTER MEDICATION Take 2 tablets by mouth every morning. "BLUE GREEN ALGAE"      . ramipril (ALTACE) 5 MG capsule Take 5 mg by mouth daily.      . ranitidine (ZANTAC) 150 MG tablet Take 150 mg by mouth daily as needed for heartburn.      . spironolactone (ALDACTONE) 25 MG tablet Take 12.5 mg by   mouth daily.      . vitamin C (ASCORBIC ACID) 500 MG tablet Take 1,000 mg by mouth daily.         Inpatient Medications:  . apixaban  5 mg Oral BID  . metoprolol succinate  100 mg Oral Daily  . ramipril  5 mg Oral Daily  . sodium chloride  3 mL Intravenous Q12H  . spironolactone  12.5 mg Oral Daily  . vitamin C  1,000 mg Oral Daily    Allergies: No Known Allergies  History   Social History  . Marital Status: Divorced    Spouse Name: N/A    Number of Children: 1  . Years of Education: N/A   Occupational History  .     Social History Main Topics  . Smoking status: Never Smoker   . Smokeless tobacco: Never Used  .  Alcohol Use: 4.2 oz/week    7 Cans of beer per week     Comment: 11/01/2012 "1 beer/day, average"  . Drug Use: Yes    Special: Marijuana     Comment: 11/01/2012 "last marijuana ~ 1 yr ago"  . Sexual Activity: Not Currently   Other Topics Concern  . Not on file   Social History Narrative   Lives alone.       Family History  Problem Relation Age of Onset  . Adopted: Yes    BP 107/70  Pulse 93  Temp(Src) 97.4 F (36.3 C) (Axillary)  Resp 18  Ht 6' (1.829 m)  Wt 164 lb 8 oz (74.617 kg)  BMI 22.31 kg/m2  SpO2 98%  Physical Exam: Filed Vitals:   04/30/14 1555 04/30/14 1658 04/30/14 1950 05/01/14 0538  BP: 110/87 115/67 128/82 107/70  Pulse: 115 109 116 93  Temp:  97.7 F (36.5 C) 97.9 F (36.6 C) 97.4 F (36.3 C)  TempSrc:  Oral Oral Axillary  Resp: 23 19 20 18  Height:      Weight:    164 lb 8 oz (74.617 kg)  SpO2: 100% 100% 100% 98%    GEN- The patient is well appearing, alert and oriented x 3 today.   Head- normocephalic, atraumatic Eyes-  Sclera clear, conjunctiva pink Ears- hearing intact Oropharynx- clear Neck- supple, Lungs- Clear to ausculation bilaterally, normal work of breathing Heart- tachycardic irregular  rhythm, no murmurs, rubs or gallops, PMI not laterally displaced GI- soft, NT, ND, + BS Extremities- no clubbing, cyanosis, or edema, groin is without hematoma/ bruit MS- no significant deformity or atrophy Skin- no rash or lesion Psych- euthymic mood, full affect Neuro- strength and sensation are intact  Labs:   Lab Results  Component Value Date   WBC 8.1 04/27/2014   HGB 14.9 04/27/2014   HCT 41.6 04/27/2014   MCV 92.2 04/27/2014   PLT 239 04/27/2014    Recent Labs Lab 04/27/14 1814  05/01/14 0320  NA 139  < > 141  K 4.2  < > 4.3  CL 101  < > 102  CO2 27  < > 24  BUN 20  < > 21  CREATININE 1.02  < > 0.98  CALCIUM 9.2  < > 9.0  PROT 7.0  --   --   BILITOT 0.4  --   --   ALKPHOS 70  --   --   ALT 24  --   --   AST 16  --   --     GLUCOSE 93  < > 88  < > = values   in this interval not displayed.  EKG:atrial fibrillation, rate 101, PVC's  TELEMETRY: atrial fibrillation, ventricular rates 90-130's  A/P  1. Persistent atrial fibrillation The patient has symptomatic recurrent atrial fibrillation with difficult to control V rates and presumed tachycardia mediated cardiomyopathy.  He has failed medical therapy with tikosyn.  His only other AAD option is amiodarone.  Therapeutic strategies for afib including medicine and ablation were discussed in detail with the patient today. Risk, benefits, and alternatives to EP study and radiofrequency ablation for afib were also discussed in detail today. These risks include but are not limited to stroke, bleeding, vascular damage, tamponade, perforation, damage to the esophagus, lungs, and other structures, pulmonary vein stenosis, worsening renal function, radiation exposure, and death. The patient understands these risk and wishes to proceed.  I will therefore schedule elective afib ablation within the next few weeks. In the interim, I will start amiodarone for rate control.  Risks, benefits, and alternatives to amiodarone were discussed at length with the patient including but not limited to pulmonary, liver, and thyroid toxicity.  He understands risks and wishes to start the medicine.  He is clear also that he would like to be discharged at this time. I would recommend starting amiodarone 400mg BID 48 hours from last dose of tikosyn (tomorrow am). Continue eliquis Follow-up with me for further discussion next week.  I will tentatively plan to perform his ablation on 05/13/14.  He will need a TEE within 24 hours of the procedure.  My office will arrange.  2. Nonischemic CM Presumed to be tachycardia mediated Continue metoprolol 100mg daily and add amiodarone for rate control Further workup and management per Dr Hochrein  DC to home today. 

## 2014-05-06 NOTE — Progress Notes (Signed)
IV saline locked. 

## 2014-05-06 NOTE — Anesthesia Procedure Notes (Signed)
Procedure Name: LMA Insertion Date/Time: 05/06/2014 7:53 AM Performed by: Jefm Miles E Pre-anesthesia Checklist: Patient identified, Emergency Drugs available, Suction available and Patient being monitored Patient Re-evaluated:Patient Re-evaluated prior to inductionOxygen Delivery Method: Circle system utilized Preoxygenation: Pre-oxygenation with 100% oxygen Intubation Type: IV induction Ventilation: Mask ventilation without difficulty and Two handed mask ventilation required LMA: LMA inserted LMA Size: 5.0 Number of attempts: 1 Placement Confirmation: positive ETCO2 and breath sounds checked- equal and bilateral Tube secured with: Tape Dental Injury: Teeth and Oropharynx as per pre-operative assessment

## 2014-05-06 NOTE — Interval H&P Note (Signed)
History and Physical Interval Note:  05/06/2014 7:26 AM  [name] [name]  has presented today for surgery, with the diagnosis of afib  The various methods of treatment have been discussed with the patient and family. After consideration of risks, benefits and other options for treatment, the patient has consented to  Procedure(s): ATRIAL FIBRILLATION ABLATION (N/A) as a surgical intervention .  The patient's history has been reviewed, patient examined, no change in status, stable for surgery.  I have reviewed the patient's chart and labs.  Questions were answered to the patient's satisfaction.    Therapeutic strategies for afib including medicine and ablation were discussed in detail with the patient today. Risk, benefits, and alternatives to EP study and radiofrequency ablation for afib were also discussed in detail today. These risks include but are not limited to stroke, bleeding, vascular damage, tamponade, perforation, damage to the esophagus, lungs, and other structures, pulmonary vein stenosis, worsening renal function, and death. The patient understands these risk and wishes to proceed.   TEE is reviewed.  He reports compliance with anticoagulation.   [name]

## 2014-05-06 NOTE — Op Note (Signed)
SURGEON:  [name], MD  PREPROCEDURE DIAGNOSES: 1. Persistent atrial fibrillation.  POSTPROCEDURE DIAGNOSES: 1. Persistent atrial fibrillation.  PROCEDURES: 1. Comprehensive electrophysiologic study. 2. Coronary sinus pacing and recording. 3. Three-dimensional mapping of atrial fibrillation (with additional mapping and ablation of additional foci within the right and left atria) 4. Ablation of atrial fibrillation (with additional mapping and ablation of additional foci within the right and left atria) 5. Intracardiac echocardiography. 6. Transseptal puncture of an intact septum. 7. Rotational Angiography with processing at an independent workstation 8. Arrhythmia induction with pacing 9.External cardioversion.  INTRODUCTION:  [name] [name] is a 53 y.o. male with a history of persistent atrial fibrillation who now presents for EP study and radiofrequency ablation.  The patient reports initially being diagnosed with atrial fibrillation after presenting with symptomatic palpitations and fatgiue.  He has a presumed tachycardia mediated cardiomyopathy.  The patient has failed medical therapy with Joice Lofts and amiodarone.  The patient therefore presents today for catheter ablation of atrial fibrillation.  DESCRIPTION OF PROCEDURE:  Informed written consent was obtained, and the patient was brought to the electrophysiology lab in a fasting state.  The patient was adequately sedated with intravenous medications as outlined in the anesthesia report.  The patient's left and right groins were prepped and draped in the usual sterile fashion by the EP lab staff.  Using a percutaneous Seldinger technique, two 7-French and one 11-French hemostasis sheaths were placed into the right common femoral vein.    3 Dimensional Rotational Angiography: A 5 french pigtail catheter was introduced through the right common femoral vein and advanced into the inferior venocava.  3 demential rotational angiography was  then performed by power injection of 100cc of nonionic contrast.  Reprocessing at an independent work station was then performed.   This demonstrated a moderate to large sized left atrium with 4 separate pulmonary veins which were also moderate in size.  There were no anomalous veins or significant abnormalities.  A 3 dimensional rendering of the left atrium was then merged using [employer] onto the WellPoint system and registered with intracardiac echo (see below).  The pigtail catheter was then removed.  Catheter Placement:  A 7-French Biosense Webster Decapolar coronary sinus catheter was introduced through the right common femoral vein and advanced into the coronary sinus for recording and pacing from this location.  A 6-French quadripolar Josephson catheter was introduced through the right common femoral vein and advanced into the right ventricle for recording and pacing.  This catheter was then pulled back to the His bundle location.    Initial Measurements: The patient presented to the electrophysiology lab in atrial fibrillation.  The average RR interval measured 750 msec.  The HV interval 48 msec.     Intracardiac Echocardiography: A 10-French Biosense Webster AcuNav intracardiac echocardiography catheter was introduced through the left common femoral vein and advanced into the right atrium. Intracardiac echocardiography was performed of the left atrium, and a three-dimensional anatomical rendering of the left atrium was performed using CARTO sound technology.  The patient was noted to have a moderate sized left atrium.  The interatrial septum was prominent but not aneurysmal. There was a small PFO. All 4 pulmonary veins were visualized and noted to have separate ostia.  The pulmonary veins were moderate in size.  The left atrial appendage was visualized and did not reveal thrombus.   There was no evidence of pulmonary vein stenosis.   Transseptal Puncture: The middle right  common femoral vein sheath was exchanged  for an 8.5 [place] SL2 transseptal sheath and transseptal access was achieved in a standard fashion using a Brockenbrough needle under biplane fluoroscopy with intracardiac echocardiography confirmation of the transseptal puncture.  Once transseptal access had been achieved, heparin was administered intravenously and intra- arterially in order to maintain an ACT of greater than 300 seconds throughout the procedure.   3D Mapping and Ablation: The His bundle catheter was removed and in its place a 3.5 mm [employer] Thermocool ablation catheter was advanced into the right atrium.  The transseptal sheath was pulled back into the IVC over a guidewire.  The ablation catheter was advanced across the transseptal hole using the wire as a guide.  The transseptal sheath was then re-advanced over the guidewire into the left atrium.  A duodecapolar Biosense Webster circular mapping catheter was introduced through the transseptal sheath and positioned over the mouth of all 4 pulmonary veins.  Three-dimensional electroanatomical mapping was performed using CARTO technology.  This demonstrated electrical activity within all four pulmonary veins at baseline. The patient underwent successful sequential electrical isolation and anatomical encircling of all four pulmonary veins using radiofrequency current with a circular mapping catheter as a guide.  A WACA approach was used. Complex fractionated electrograms (CFAEs) were then identified and ablated along the roof of the left atrium, base of the left atrial appendage (LAA),  along the ridge between the left superior pulmonary vein and the left atrial appendage, the interatrial septum, along the lateral wall of the left atrium, and above the coronary sinus.    Cardioversion: The patient was then cardioverted to sinus rhythm with a single synchronized 360-J biphasic shock with cardioversion electrodes in the  anterior-posterior thoracic configuration.   He remained in sinus rhythm thereafter.  The circular mapping catheter was pulled back into the right atrium and 3D mapping was performed at the junction of the superior vena cava and right atrium.  Electrical activity was observed within the SVC.  I therefore elected to perform right atrial ablation in this area.  A series of radiofrequency applications were delivered in a circular fashion around the ostium of the SVC.  Prior to each ablation lesions, pacing was performed from the distal ablation electrode to insure that diaphragmatic stimulation was not observed to avoid phrenic nerve injury.  Diaphragmatic excursion was also observed during ablation.     Measurements Following Ablation: In sinus rhythm the RR interval was , with PR 136 msec, QRS 95 msec, and Qtc 483 msec.  Following ablation the AH interval measured 54 msec with an HV interval of 56 msec. Ventricular pacing was performed, which revealed midline decremental VA conduction with a VA Wenckebach cycle length of 550 msec.  Rapid atrial pacing was performed, which revealed an AV Wenckebach cycle length of 420 msec.  Electroisolation was then again confirmed in all four pulmonary veins. Rapid atrial pacing was continued down to a cycle length of 250 msec with no arrhythmias induced.  The procedure was therefore considered completed.  All catheters were removed, and the sheaths were aspirated and flushed.  The patient was transferred to the recovery area for sheath removal per protocol.  A limited bedside transthoracic echocardiogram revealed no pericardial effusion. EBL<73ml.  There were no early apparent complications.  CONCLUSIONS: 1. Atrial fibrillation upon presentation.   2. Rotational Angiography reveals a moderate to large sized left atrium with four separate pulmonary veins without evidence of pulmonary vein stenosis. 3. Successful electrical isolation and anatomical encircling  of all four  pulmonary veins with radiofrequency current (WACA approach). 3. CFAEs were identified and ablated along the roof of the left atrium, base of the left atrial appendage (LAA),  along the ridge between the left superior pulmonary vein and the left atrial appendage, the interatrial septum, along the lateral wall of the left atrium, and above the coronary sinus.   Additional ablation was performed at the junction of the SVC and RA. 4. Atrial fibrillation successfully cardioverted to sinus rhythm. 5. No early apparent complications.   [name] Trindade,MD 11:09 AM 05/06/2014

## 2014-05-06 NOTE — Discharge Summary (Signed)
ELECTROPHYSIOLOGY PROCEDURE DISCHARGE SUMMARY    Patient ID: [name] [name],  MRN: [id number], DOB/AGE: 1960/12/11 53 y.o.  Admit date: 05/06/2014 Discharge date: 05/07/2014  Primary Care Physician: Thane Edu, MD Primary Cardiologist: Hochrein Electrophysiologist: [name], MD  Primary Discharge Diagnosis:  Persistent atrial fibrillation status post ablation this admission  Secondary Discharge Diagnosis:  1.  GERD 2.  Prior presumed tachycardia mediated cardiomyopathy  Procedures This Admission:  1.  Electrophysiology study and radiofrequency catheter ablation on 05-06-14 by Dr [name].  This study demonstrated atrial fibrillation upon presentation; rotational Angiography reveals a moderate to large sized left atrium with four separate pulmonary veins without evidence of pulmonary vein stenosis; successful electrical isolation and anatomical encircling of all four pulmonary veins with radiofrequency current (WACA approach); CFAEs were identified and ablated along the roof of the left atrium, base of the left atrial appendage (LAA), along the ridge between the left superior pulmonary vein and the left atrial appendage, the interatrial septum, along the lateral wall of the left atrium, and above the coronary sinus. Additional ablation was performed at the junction of the SVC and RA; atrial fibrillation successfully cardioverted to sinus rhythm. There were no early apparent complications.   Brief HPI: [name] [name] is a 53 y.o. male with a history of persistent atrial fibrillation.  They have failed medical therapy with Tikosyn and amiodarone. Risks, benefits, and alternatives to catheter ablation of atrial fibrillation were reviewed with the patient who wished to proceed.  The patient underwent TEE prior to the procedure which demonstrated normal LV function and no LAA thrombus.    Hospital Course:  The patient was admitted and underwent EPS/RFCA of atrial  fibrillation with details as outlined above.  They were monitored on telemetry overnight which demonstrated sinus rhythm.  Groin was without complication on the day of discharge.  The patient was examined by Dr Johney Frame and considered to be stable for discharge.  Wound care and restrictions were reviewed with the patient.  The patient will be seen back by Dr Johney Frame in 12 weeks for post ablation follow up.  He will follow-up with Dr Antoine Poche in 6 weeks for CHF management.  Discharge Vitals: Blood pressure 99/59, pulse 66, temperature 97.8 F (36.6 C), temperature source Oral, resp. rate 14, height 6' (1.829 m), weight 160 lb (72.576 kg), SpO2 99.00%.  Physical Exam: Filed Vitals:   05/06/14 2000 05/06/14 2314 05/07/14 0323 05/07/14 0759  BP: 104/55 99/48 99/50  99/59  Pulse: 72 72 66 66  Temp:  98.5 F (36.9 C) 97.9 F (36.6 C) 97.8 F (36.6 C)  TempSrc:  Oral Oral Oral  Resp: 12 15 16 14   Height:      Weight:      SpO2: 100% 97% 100% 99%    GEN- The patient is well appearing, alert and oriented x 3 today.   Head- normocephalic, atraumatic Eyes-  Sclera clear, conjunctiva pink Ears- hearing intact Oropharynx- clear Neck- supple, Lungs- Clear to ausculation bilaterally, normal work of breathing Heart- Regular rate and rhythm, no murmurs, rubs or gallops, PMI not laterally displaced GI- soft, NT, ND, + BS Extremities- no clubbing, cyanosis, or edema, groin is without hematoma/ bruit MS- no significant deformity or atrophy Skin- no rash or lesion Psych- euthymic mood, full affect Neuro- strength and sensation are intact   Labs:   Lab Results  Component Value Date   WBC 8.1 04/27/2014   HGB 14.9 04/27/2014   HCT 41.6 04/27/2014   MCV 92.2 04/27/2014  PLT 239 04/27/2014     Recent Labs Lab 05/07/14 0238  NA 139  K 4.0  CL 104  CO2 24  BUN 14  CREATININE 0.96  CALCIUM 8.5  GLUCOSE 153*      Discharge Medications:    Medication List         amiodarone 200 MG tablet   Commonly known as:  PACERONE  Take 1 tablet (200 mg total) by mouth daily.     ELIQUIS 5 MG Tabs tablet  Generic drug:  apixaban  Take 5 mg by mouth 2 (two) times daily.     Fish Oil 1000 MG Caps  Take 1,000 mg by mouth daily.     ibuprofen 200 MG tablet  Commonly known as:  ADVIL,MOTRIN  Take 400-800 mg by mouth daily as needed (pain).     metoprolol succinate 100 MG 24 hr tablet  Commonly known as:  TOPROL-XL  Take 1 tablet (100 mg total) by mouth daily.     OVER THE COUNTER MEDICATION  Take 2 tablets by mouth daily. "BLUE GREEN ALGAE"     pantoprazole 40 MG tablet  Commonly known as:  PROTONIX  Take 1 tablet (40 mg total) by mouth daily.     ramipril 5 MG capsule  Commonly known as:  ALTACE  Take 5 mg by mouth daily.     ranitidine 150 MG tablet  Commonly known as:  ZANTAC  Take 150 mg by mouth daily as needed for heartburn.     spironolactone 25 MG tablet  Commonly known as:  ALDACTONE  Take 12.5 mg by mouth daily.     VITAMIN B-12 PO  Take 1 tablet by mouth daily with breakfast.     vitamin C 1000 MG tablet  Take 1,000 mg by mouth daily.     Vitamin D 2000 UNITS tablet  Take 4,000 Units by mouth daily.        Disposition:   Follow-up Information   Follow up with Hillis RangeAllred, Shlomie Romig, MD On 08/18/2014. (10 am)    Specialty:  Cardiology   Contact information:   [address] [place] [address] [phone]       Follow up with Rollene RotundaJames Hochrein, MD In 6 weeks.   Specialty:  Cardiology   Contact information:   [address] [place] [address] [phone]       Duration of Discharge Encounter: Greater than 30 minutes including physician time.  Signed,  Hillis RangeJames Fatima Fedie MD

## 2014-05-06 NOTE — Transfer of Care (Signed)
Immediate Anesthesia Transfer of Care Note  Patient: [name] [name]  Procedure(s) Performed: Procedure(s): ATRIAL FIBRILLATION ABLATION (N/A)  Patient Location: Endoscopy Unit  Anesthesia Type:General  Level of Consciousness: lethargic and responds to stimulation  Airway & Oxygen Therapy: Patient Spontanous Breathing and Patient connected to nasal cannula oxygen  Post-op Assessment: Report given to PACU RN  Post vital signs: Reviewed and stable  Complications: No apparent anesthesia complications

## 2014-05-06 NOTE — Anesthesia Preprocedure Evaluation (Signed)
Anesthesia Evaluation  Patient identified by MRN, date of birth, ID band Patient awake    Reviewed: Allergy & Precautions, NPO status , Patient's Chart, lab work & pertinent test results, reviewed documented beta blocker date and time   History of Anesthesia Complications Negative for: history of anesthetic complications  Airway Mallampati: II TM Distance: >3 FB Neck ROM: Full    Dental  (+) Teeth Intact, Dental Advisory Given   Pulmonary neg pulmonary ROS,  breath sounds clear to auscultation        Cardiovascular + dysrhythmias Rhythm:Irregular Rate:Normal     Neuro/Psych negative neurological ROS     GI/Hepatic Neg liver ROS, GERD-  Medicated and Controlled,  Endo/Other  negative endocrine ROS  Renal/GU      Musculoskeletal negative musculoskeletal ROS (+)   Abdominal   Peds  Hematology negative hematology ROS (+)   Anesthesia Other Findings   Reproductive/Obstetrics negative OB ROS                           Anesthesia Physical Anesthesia Plan  ASA: II  Anesthesia Plan: MAC   Post-op Pain Management:    Induction:   Airway Management Planned:   Additional Equipment:   Intra-op Plan:   Post-operative Plan:   Informed Consent:   Plan Discussed with:   Anesthesia Plan Comments:         Anesthesia Quick Evaluation

## 2014-05-06 NOTE — Anesthesia Postprocedure Evaluation (Signed)
  Anesthesia Post-op Note  Patient: [name] [name]  Procedure(s) Performed: Procedure(s): ATRIAL FIBRILLATION ABLATION (N/A)  Patient Location: PACU  Anesthesia Type:MAC  Level of Consciousness: awake and alert   Airway and Oxygen Therapy: Patient Spontanous Breathing  Post-op Pain: none  Post-op Assessment: Post-op Vital signs reviewed  Post-op Vital Signs: stable  Last Vitals:  Filed Vitals:   05/06/14 1225  BP: 112/58  Pulse: 61  Temp:   Resp: 18    Complications: No apparent anesthesia complications

## 2014-05-06 NOTE — Progress Notes (Signed)
Utilization Review Completed.Romari Gasparro T10/13/2015  

## 2014-05-06 NOTE — Progress Notes (Signed)
Site area: rt groin Site Prior to Removal:  Level 0 Pressure Applied For: 20 minutes Manual:   yes Patient Status During Pull:  stable Post Pull Site:  Level 0 Post Pull Instructions Given:  yes Post Pull Pulses Present: yes Dressing Applied:  tegaderm dressing Bedrest begins @ 1225 Comments: no complications

## 2014-05-07 DIAGNOSIS — Z7901 Long term (current) use of anticoagulants: Secondary | ICD-10-CM

## 2014-05-07 DIAGNOSIS — I429 Cardiomyopathy, unspecified: Secondary | ICD-10-CM

## 2014-05-07 DIAGNOSIS — I480 Paroxysmal atrial fibrillation: Secondary | ICD-10-CM

## 2014-05-07 DIAGNOSIS — I481 Persistent atrial fibrillation: Secondary | ICD-10-CM | POA: Diagnosis not present

## 2014-05-07 LAB — BASIC METABOLIC PANEL
Anion gap: 11 (ref 5–15)
BUN: 14 mg/dL (ref 6–23)
CO2: 24 mEq/L (ref 19–32)
Calcium: 8.5 mg/dL (ref 8.4–10.5)
Chloride: 104 mEq/L (ref 96–112)
Creatinine, Ser: 0.96 mg/dL (ref 0.50–1.35)
GFR calc Af Amer: >90 mL/min (ref >90)
GLUCOSE: 153 mg/dL — AB (ref 70–99)
POTASSIUM: 4 meq/L (ref 3.7–5.3)
Sodium: 139 mEq/L (ref 137–147)

## 2014-05-07 MED ORDER — AMIODARONE HCL 200 MG PO TABS: 200.0000 mg | ORAL_TABLET | Freq: Every day | ORAL | Status: DC

## 2014-05-07 MED ORDER — METOPROLOL SUCCINATE ER 100 MG PO TB24: 50.0000 mg | ORAL_TABLET | Freq: Every day | ORAL | Status: DC

## 2014-05-07 MED ORDER — PANTOPRAZOLE SODIUM 40 MG PO TBEC: 40.0000 mg | DELAYED_RELEASE_TABLET | Freq: Every day | ORAL | Status: DC

## 2014-05-09 ENCOUNTER — Other Ambulatory Visit: Payer: No Typology Code available for payment source

## 2014-05-09 ENCOUNTER — Encounter: Payer: No Typology Code available for payment source | Admitting: Physician Assistant

## 2014-05-14 ENCOUNTER — Telehealth: Payer: Self-pay | Admitting: Cardiology

## 2014-05-14 NOTE — Telephone Encounter (Signed)
Closed encounter °

## 2014-05-17 ENCOUNTER — Other Ambulatory Visit: Payer: Self-pay | Admitting: Cardiology

## 2014-05-20 ENCOUNTER — Other Ambulatory Visit: Payer: Self-pay | Admitting: Cardiology

## 2014-06-03 ENCOUNTER — Ambulatory Visit (INDEPENDENT_AMBULATORY_CARE_PROVIDER_SITE_OTHER): Payer: No Typology Code available for payment source | Admitting: Cardiology

## 2014-06-03 ENCOUNTER — Encounter: Payer: Self-pay | Admitting: Cardiology

## 2014-06-03 VITALS — BP 104/62 | HR 58 | Ht 72.0 in | Wt 173.6 lb

## 2014-06-03 DIAGNOSIS — I429 Cardiomyopathy, unspecified: Secondary | ICD-10-CM

## 2014-06-03 DIAGNOSIS — I4819 Other persistent atrial fibrillation: Secondary | ICD-10-CM

## 2014-06-03 DIAGNOSIS — I481 Persistent atrial fibrillation: Secondary | ICD-10-CM

## 2014-06-03 NOTE — Progress Notes (Signed)
HPI The patient presents for follow up other reduced ejection fraction and atrial fibrillation.  He had previous cardioversion but failed to maintain sinus rhythm. He also failed cardioversion after hospitalization and administration of Tikosyn.  He subsequently had atrial fibrillation ablation by Dr. Johney FrameAllred. He also has a mildly reduced ejection fraction with a negative perfusion study. This is felt to be a rate related cardiomyopathy. He now returns for followup.   He said that since his procedure he has felt much better. He may have had a rare palpitation but nothing sustained. He's not having any acute shortness of breath, PND or orthopnea. He's not having any presyncope or syncope. He denies any chest pressure, neck or arm discomfort.    No Known Allergies  Current Outpatient Prescriptions  Medication Sig Dispense Refill  . amiodarone (PACERONE) 200 MG tablet Take 1 tablet (200 mg total) by mouth daily. 120 tablet 6  . apixaban (ELIQUIS) 5 MG TABS tablet Take 5 mg by mouth 2 (two) times daily.    . Ascorbic Acid (VITAMIN C) 1000 MG tablet Take 1,000 mg by mouth daily.    . Cholecalciferol (VITAMIN D) 2000 UNITS tablet Take 4,000 Units by mouth daily.    . Cyanocobalamin (VITAMIN B-12 PO) Take 1 tablet by mouth daily with breakfast.    . ibuprofen (ADVIL,MOTRIN) 200 MG tablet Take 400-800 mg by mouth daily as needed (pain).    . metoprolol succinate (TOPROL-XL) 100 MG 24 hr tablet Take 1 tablet (100 mg total) by mouth daily. 30 tablet 6  . Omega-3 Fatty Acids (FISH OIL) 1000 MG CAPS Take 1,000 mg by mouth daily.    Marland Kitchen. OVER THE COUNTER MEDICATION Take 2 tablets by mouth daily. "BLUE GREEN ALGAE"    . pantoprazole (PROTONIX) 40 MG tablet Take 1 tablet (40 mg total) by mouth daily. 60 tablet 0  . ramipril (ALTACE) 5 MG capsule Take 5 mg by mouth daily.    . ranitidine (ZANTAC) 150 MG tablet Take 150 mg by mouth daily as needed for heartburn.    . spironolactone (ALDACTONE) 25 MG tablet take  1/2 tablet by mouth once daily 15 tablet 6   No current facility-administered medications for this visit.    Past Medical History  Diagnosis Date  . GERD (gastroesophageal reflux disease)   . PAF (paroxysmal atrial fibrillation)     a. Dx 10/2012 s/p DCCV. b. Recurrence noted 03/2014, failed Tikosyn therapy.  . Adopted   . Anxiety   . Depression   . LV dysfunction     a. EF  45% during 10/2012 admission with no definite ischemia by nuc at that time. b. EF 30-35% by echo 03/2014.  Marland Kitchen. Dysrhythmia 10/2012    afib    Past Surgical History  Procedure Laterality Date  . Tee without cardioversion N/A 11/02/2012    Procedure: TRANSESOPHAGEAL ECHOCARDIOGRAM (TEE);  Surgeon: Ricki RodriguezAjay S Kadakia, MD;  [facility];  Service: Cardiovascular;  Laterality: N/A;  . Cardioversion N/A 11/02/2012    Procedure: CARDIOVERSION;  Surgeon: Ricki RodriguezAjay S Kadakia, MD;  [facility];  Service: Cardiovascular;  Laterality: N/A;  . Cardioversion  04-30-2014    with ERAF  . Cardioversion N/A 04/30/2014    Procedure: CARDIOVERSION;  Surgeon: Chrystie NoseKenneth C. Hilty, MD;  [facility];  Service: Cardiovascular;  Laterality: N/A;  . Tee without cardioversion N/A 05/05/2014    Procedure: TRANSESOPHAGEAL ECHOCARDIOGRAM (TEE);  Surgeon: Lewayne BuntingBrian S Crenshaw, MD;  [facility];  Service: Cardiovascular;  Laterality:  N/A;    ROS:  As stated in the HPI and negative for all other systems.  PHYSICAL EXAM BP 104/62 mmHg  Pulse 58  Ht 6' (1.829 m)  Wt 173 lb 9.6 oz (78.744 kg)  BMI 23.54 kg/m2 GENERAL:  Well appearing NECK:  No jugular venous distention, waveform within normal limits, carotid upstroke brisk and symmetric, no bruits, no thyromegalyy LUNGS:  Clear to auscultation bilaterally HEART:  PMI not displaced or sustained,S1 and S2 within normal limits, no S3,  no clicks, no rubs, no murmurs, irregular ABD:  Flat, positive bowel sounds normal in frequency in pitch, no bruits, no rebound, no guarding,  no midline pulsatile mass, no hepatomegaly, no splenomegaly EXT:  2 plus pulses throughout, no edema, no cyanosis no clubbing   EKG:  Sinus rhythm, rate 58, axis within normal limits, intervals within normal limits, nonspecific T-wave flattening.  06/03/2014   ASSESSMENT AND PLAN  ATRIAL FIB:  He seems to be maintaining sinus rhythm. This point I'm going to continue the amiodarone any anticoagulation. All deferred to Dr. Johney Frame and I suspect he will discontinue the amiodarone January.  CARDIOMYOPATHY:  For now his blood pressure would not tolerate med titration. He seems to be euvolemic. We will continue the meds as listed and I will followup with an echocardiogram probably sometime near the spring of next year.

## 2014-06-03 NOTE — Patient Instructions (Signed)
Continue same medications   Your physician recommends that you schedule a follow-up appointment Tue 09/23/14 at 3:00 pm

## 2014-06-13 ENCOUNTER — Ambulatory Visit: Payer: No Typology Code available for payment source | Admitting: Cardiology

## 2014-07-03 ENCOUNTER — Encounter (HOSPITAL_COMMUNITY): Payer: Self-pay | Admitting: Internal Medicine

## 2014-07-07 ENCOUNTER — Other Ambulatory Visit: Payer: Self-pay | Admitting: *Deleted

## 2014-07-07 MED ORDER — PANTOPRAZOLE SODIUM 40 MG PO TBEC: 40.0000 mg | DELAYED_RELEASE_TABLET | Freq: Every day | ORAL | Status: DC

## 2014-07-09 ENCOUNTER — Other Ambulatory Visit: Payer: Self-pay | Admitting: Cardiology

## 2014-07-21 ENCOUNTER — Other Ambulatory Visit: Payer: Self-pay | Admitting: Cardiology

## 2014-08-18 ENCOUNTER — Encounter: Payer: Self-pay | Admitting: Internal Medicine

## 2014-08-18 ENCOUNTER — Ambulatory Visit (INDEPENDENT_AMBULATORY_CARE_PROVIDER_SITE_OTHER): Payer: 59 | Admitting: Internal Medicine

## 2014-08-18 VITALS — BP 112/70 | HR 49 | Ht 72.0 in | Wt 178.1 lb

## 2014-08-18 DIAGNOSIS — I4819 Other persistent atrial fibrillation: Secondary | ICD-10-CM

## 2014-08-18 DIAGNOSIS — I481 Persistent atrial fibrillation: Secondary | ICD-10-CM

## 2014-08-18 DIAGNOSIS — I429 Cardiomyopathy, unspecified: Secondary | ICD-10-CM

## 2014-08-18 MED ORDER — METOPROLOL SUCCINATE ER 50 MG PO TB24: 50.0000 mg | ORAL_TABLET | Freq: Every day | ORAL | Status: DC

## 2014-08-18 MED ORDER — AMIODARONE HCL 200 MG PO TABS: 100.0000 mg | ORAL_TABLET | Freq: Every day | ORAL | Status: DC

## 2014-08-18 NOTE — Patient Instructions (Signed)
Your physician has requested that you have an echocardiogram. Echocardiography is a painless test that uses sound waves to create images of your heart. It provides your doctor with information about the size and shape of your heart and how well your heart's chambers and valves are working. This procedure takes approximately one hour. There are no restrictions for this procedure.--NEEDS before his appointment on 09/23/14 with Dr Antoine Poche   Your physician recommends that you schedule a follow-up appointment in: 3 months wit Rudi Coco, NP   Your physician wants you to follow-up in: 6 months with Dr. Jacquiline Doe will receive a reminder letter in the mail two months in advance. If you don't receive a letter, please call our office to schedule the follow-up appointment.  Your physician has recommended you make the following change in your medication:  1) STOP Protonix 2) Decrease Metoprolol to 50mg  daily

## 2014-08-18 NOTE — Progress Notes (Signed)
Electrophysiology Office Note   Date:  08/18/2014   ID:  [name] [name], Tham February 22, 1961, MRN [id number]  PCP:  Thane Edu, MD  Cardiologist:  Antoine Poche Primary Electrophysiologist:  [name], MD    Chief Complaint  Patient presents with  . Atrial Fibrillation     History of Present Illness: [name] [name] is a 54 y.o. male who presents today for electrophysiology evaluation.   He has done well since his afib ablation.  He denies procedure related complications.  He has had no afib.  He denies CHF symptoms. Today, he denies symptoms of palpitations, chest pain, shortness of breath, orthopnea, PND, lower extremity edema, claudication, dizziness, presyncope, syncope, bleeding, or neurologic sequela. The patient is tolerating medications without difficulties and is otherwise without complaint today.    Past Medical History  Diagnosis Date  . GERD (gastroesophageal reflux disease)   . PAF (paroxysmal atrial fibrillation)     a. Dx 10/2012 s/p DCCV. b. Recurrence noted 03/2014, failed Tikosyn therapy.  . Adopted   . Anxiety   . Depression   . LV dysfunction     a. EF  45% during 10/2012 admission with no definite ischemia by nuc at that time. b. EF 30-35% by echo 03/2014.  Marland Kitchen Dysrhythmia 10/2012    afib   Past Surgical History  Procedure Laterality Date  . Tee without cardioversion N/A 11/02/2012    Procedure: TRANSESOPHAGEAL ECHOCARDIOGRAM (TEE);  Surgeon: Ricki Rodriguez, MD;  [facility] ENDOSCOPY;  Service: Cardiovascular;  Laterality: N/A;  . Cardioversion N/A 11/02/2012    Procedure: CARDIOVERSION;  Surgeon: Ricki Rodriguez, MD;  Location: [facility] ENDOSCOPY;  Service: Cardiovascular;  Laterality: N/A;  . Cardioversion  04-30-2014    with ERAF  . Cardioversion N/A 04/30/2014    Procedure: CARDIOVERSION;  Surgeon: Chrystie Nose, MD;  Location: [facility] ENDOSCOPY;  Service: Cardiovascular;  Laterality: N/A;  . Tee without cardioversion N/A 05/05/2014    Procedure:  TRANSESOPHAGEAL ECHOCARDIOGRAM (TEE);  Surgeon: Lewayne Bunting, MD;  Location: [facility] ENDOSCOPY;  Service: Cardiovascular;  Laterality: N/A;  . Atrial fibrillation ablation N/A 05/06/2014    Procedure: ATRIAL FIBRILLATION ABLATION;  Surgeon: Gardiner Rhyme, MD;  [facility]: [facility] CATH LAB;  Service: Cardiovascular;  Laterality: N/A;     Current Outpatient Prescriptions  Medication Sig Dispense Refill  . amiodarone (PACERONE) 200 MG tablet Take 0.5 tablets (100 mg total) by mouth daily. 45 tablet 3  . Ascorbic Acid (VITAMIN C) 1000 MG tablet Take 1,000 mg by mouth daily.    . Cholecalciferol (VITAMIN D) 2000 UNITS tablet Take 4,000 Units by mouth daily.    . Cyanocobalamin (VITAMIN B-12 PO) Take 1 tablet by mouth daily with breakfast.    . ELIQUIS 5 MG TABS tablet take 1 tablet by mouth twice a day 60 tablet 5  . ibuprofen (ADVIL,MOTRIN) 200 MG tablet Take 400-800 mg by mouth daily as needed (pain).    . metoprolol succinate (TOPROL-XL) 50 MG 24 hr tablet Take 1 tablet (50 mg total) by mouth daily. 90 tablet 3  . Omega-3 Fatty Acids (FISH OIL) 1000 MG CAPS Take 1,000 mg by mouth daily.    Marland Kitchen OVER THE COUNTER MEDICATION Take 2 tablets by mouth daily. "BLUE GREEN ALGAE"    . ramipril (ALTACE) 5 MG capsule take 1 capsule by mouth once daily 30 capsule 3  . ranitidine (ZANTAC) 150 MG tablet Take 150 mg by mouth daily as needed for heartburn.    . spironolactone (ALDACTONE) 25 MG tablet  take 1/2 tablet by mouth once daily 15 tablet 6   No current facility-administered medications for this visit.    Allergies:   Review of patient's allergies indicates no known allergies.   Social History:  The patient  reports that he has never smoked. He has never used smokeless tobacco. He reports that he drinks about 4.2 oz of alcohol per week. He reports that he uses illicit drugs (Marijuana).   Family History:  Adopted and unaware of FH   ROS:  Please see the history of present illness.   All other systems are  reviewed and negative.    PHYSICAL EXAM: VS:  BP 112/70 mmHg  Pulse 49  Ht 6' (1.829 m)  Wt 178 lb 1.9 oz (80.795 kg)  BMI 24.15 kg/m2 , BMI Body mass index is 24.15 kg/(m^2). GEN: Well nourished, well developed, in no acute distress HEENT: normal Neck: no JVD, carotid bruits, or masses Cardiac: RRR; no murmurs, rubs, or gallops,no edema  Respiratory:  clear to auscultation bilaterally, normal work of breathing GI: soft, nontender, nondistended, + BS MS: no deformity or atrophy Skin: warm and dry,  Neuro:  Strength and sensation are intact Psych: euthymic mood, full affect  EKG:  EKG is ordered today. The ekg ordered today shows sinus bradycardia 49 bpm, LVH, Qtc 419    Recent Labs: 04/27/2014: ALT 24; Hemoglobin 14.9; Platelets 239; TSH 2.220 05/01/2014: Magnesium 2.1 05/07/2014: BUN 14; Creatinine 0.96; Potassium 4.0; Sodium 139    Lipid Panel     Component Value Date/Time   CHOL 125 11/02/2012 0440   TRIG 107 11/02/2012 0440   HDL 37* 11/02/2012 0440   CHOLHDL 3.4 11/02/2012 0440   VLDL 21 11/02/2012 0440   LDLCALC 67 11/02/2012 0440     Wt Readings from Last 3 Encounters:  08/18/14 178 lb 1.9 oz (80.795 kg)  06/03/14 173 lb 9.6 oz (78.744 kg)  05/06/14 160 lb (72.576 kg)      Other studies Reviewed: Additional studies/ records that were reviewed today include: Dr Lindaann Slough note from 11/15      ASSESSMENT AND PLAN:  1.  afib Maintaining sinus rhythm post ablation Decrease amiodarone to  daily today Stop amiodarone upon follow-up with Dr Antoine Poche in 6 weeks Continue eliquis   2. Nonischemic CH Repeat echo in 6 weeks when he sees hochrein Decrease toprol to  daily  3. Bradycardia Decrease amiodarone and metoprolol as above  Follow-up with Dr Antoine Poche in 6 weeks Follow-up with Rudi Coco NP in 3 months Follow-up with me in 6 months    Current medicines are reviewed at length with the patient today.   The patient does not have  concerns regarding his medicines.  The following changes were made today:  none  Labs/ tests ordered today include:  Orders Placed This Encounter  Procedures  . EKG 12-Lead  . 2D Echocardiogram without contrast      Signed, [name], MD  08/18/2014 11:11 AM     [facility]: [facility] [address] [place] [address] [phone] (office) [phone] (fax)

## 2014-09-10 ENCOUNTER — Telehealth: Payer: Self-pay | Admitting: *Deleted

## 2014-09-10 MED ORDER — METOPROLOL SUCCINATE ER 100 MG PO TB24: ORAL_TABLET | ORAL | Status: DC

## 2014-09-10 NOTE — Telephone Encounter (Signed)
Patient called wanting 100mg  tablet of metoprolol called in and he break it in half rather than the 50mg  tablets that were called in. Rx sent to pharmacy for 100mg  with instructions to take 1/2 tablet daily as directed by Dr. Johney Frame

## 2014-09-23 ENCOUNTER — Ambulatory Visit (INDEPENDENT_AMBULATORY_CARE_PROVIDER_SITE_OTHER): Payer: 59 | Admitting: Cardiology

## 2014-09-23 ENCOUNTER — Telehealth: Payer: Self-pay | Admitting: Cardiology

## 2014-09-23 ENCOUNTER — Ambulatory Visit (HOSPITAL_COMMUNITY): Payer: 59 | Attending: Cardiology

## 2014-09-23 ENCOUNTER — Encounter: Payer: Self-pay | Admitting: Cardiology

## 2014-09-23 VITALS — BP 110/80 | HR 64 | Ht 72.0 in | Wt 177.8 lb

## 2014-09-23 DIAGNOSIS — I4819 Other persistent atrial fibrillation: Secondary | ICD-10-CM

## 2014-09-23 DIAGNOSIS — I4891 Unspecified atrial fibrillation: Secondary | ICD-10-CM

## 2014-09-23 DIAGNOSIS — I481 Persistent atrial fibrillation: Secondary | ICD-10-CM

## 2014-09-23 DIAGNOSIS — Z79899 Other long term (current) drug therapy: Secondary | ICD-10-CM

## 2014-09-23 MED ORDER — METOPROLOL SUCCINATE ER 100 MG PO TB24: ORAL_TABLET | ORAL | Status: DC

## 2014-09-23 MED ORDER — METOPROLOL SUCCINATE ER 100 MG PO TB24: 100.0000 mg | ORAL_TABLET | Freq: Every day | ORAL | Status: DC

## 2014-09-23 MED ORDER — AMIODARONE HCL 200 MG PO TABS: 200.0000 mg | ORAL_TABLET | Freq: Every day | ORAL | Status: DC

## 2014-09-23 NOTE — Patient Instructions (Signed)
Dr Antoine Poche has ordered the following test(s) to be done: 1. Blood work - to be done in April  Your physician has made no changes in your current medications or treatment plan.  Dr Antoine Poche wants you to follow-up in 6 months. You will receive a reminder letter in the mail one months in advance. If you don't receive a letter, please call our office to schedule the follow-up appointment.

## 2014-09-23 NOTE — Telephone Encounter (Signed)
Returned call to pharmacist at [facility].Advised patient is taking Metoprolol Succ 100 mg 1/2 tablet daily.

## 2014-09-23 NOTE — Progress Notes (Signed)
HPI The patient presents for follow up other reduced ejection fraction and atrial fibrillation.  He had previous cardioversion but failed to maintain sinus rhythm. He also failed cardioversion after hospitalization and administration of Tikosyn.  He subsequently had atrial fibrillation ablation by Dr. Johney Frame. He also has a mildly reduced ejection fraction with a negative perfusion study. This is felt to be a rate related cardiomyopathy. He now returns for followup.  Today he had an echocardiogram and I am delighted to see that his ejection fraction is now 60%. He has done well. He's been walking for exercise. The patient denies any new symptoms such as chest discomfort, neck or arm discomfort. There has been no new shortness of breath, PND or orthopnea. There have been no reported palpitations, presyncope or syncope.   No Known Allergies  Current Outpatient Prescriptions  Medication Sig Dispense Refill  . amiodarone (PACERONE) 200 MG tablet Take 0.5 tablets (100 mg total) by mouth daily. 45 tablet 3  . Ascorbic Acid (VITAMIN C) 1000 MG tablet Take 1,000 mg by mouth daily.    . Cholecalciferol (VITAMIN D) 2000 UNITS tablet Take 4,000 Units by mouth daily.    . Cyanocobalamin (VITAMIN B-12 PO) Take 1 tablet by mouth daily with breakfast.    . ELIQUIS 5 MG TABS tablet take 1 tablet by mouth twice a day 60 tablet 5  . ibuprofen (ADVIL,MOTRIN) 200 MG tablet Take 400-800 mg by mouth daily as needed (pain).    . metoprolol succinate (TOPROL-XL) 100 MG 24 hr tablet Take 1/2 tablet daily by mouth as directed 30 tablet 3  . Omega-3 Fatty Acids (FISH OIL) 1000 MG CAPS Take 1,000 mg by mouth daily.    Marland Kitchen OVER THE COUNTER MEDICATION Take 2 tablets by mouth daily. "BLUE GREEN ALGAE"    . ramipril (ALTACE) 5 MG capsule take 1 capsule by mouth once daily 30 capsule 3  . ranitidine (ZANTAC) 150 MG tablet Take 150 mg by mouth daily as needed for heartburn.    . spironolactone (ALDACTONE) 25 MG tablet take 1/2  tablet by mouth once daily 15 tablet 6   No current facility-administered medications for this visit.    Past Medical History  Diagnosis Date  . GERD (gastroesophageal reflux disease)   . PAF (paroxysmal atrial fibrillation)     a. Dx 10/2012 s/p DCCV. b. Recurrence noted 03/2014, failed Tikosyn therapy.  . Adopted   . Anxiety   . Depression   . LV dysfunction     a. EF  45% during 10/2012 admission with no definite ischemia by nuc at that time. b. EF 30-35% by echo 03/2014.  Marland Kitchen Dysrhythmia 10/2012    afib    Past Surgical History  Procedure Laterality Date  . Tee without cardioversion N/A 11/02/2012    Procedure: TRANSESOPHAGEAL ECHOCARDIOGRAM (TEE);  Surgeon: Ricki Rodriguez, MD;  Location: [facility] ENDOSCOPY;  Service: Cardiovascular;  Laterality: N/A;  . Cardioversion N/A 11/02/2012    Procedure: CARDIOVERSION;  Surgeon: Ricki Rodriguez, MD;  Location: [facility] ENDOSCOPY;  Service: Cardiovascular;  Laterality: N/A;  . Cardioversion  04-30-2014    with ERAF  . Cardioversion N/A 04/30/2014    Procedure: CARDIOVERSION;  Surgeon: Chrystie Nose, MD;  Location: [facility] ENDOSCOPY;  Service: Cardiovascular;  Laterality: N/A;  . Tee without cardioversion N/A 05/05/2014    Procedure: TRANSESOPHAGEAL ECHOCARDIOGRAM (TEE);  Surgeon: Lewayne Bunting, MD;  Location: [facility] ENDOSCOPY;  Service: Cardiovascular;  Laterality: N/A;  . Atrial fibrillation ablation N/A 05/06/2014  Procedure: ATRIAL FIBRILLATION ABLATION;  Surgeon: Gardiner Rhyme, MD;  Location: [facility] CATH LAB;  Service: Cardiovascular;  Laterality: N/A;    ROS:  As stated in the HPI and negative for all other systems.  PHYSICAL EXAM BP 110/80 mmHg  Pulse 64  Ht 6' (1.829 m)  Wt 177 lb 12.8 oz (80.65 kg)  BMI 24.11 kg/m2 GENERAL:  Well appearing NECK:  No jugular venous distention, waveform within normal limits, carotid upstroke brisk and symmetric, no bruits, no thyromegalyy LUNGS:  Clear to auscultation bilaterally HEART:  PMI not displaced or  sustained,S1 and S2 within normal limits, no S3,  no clicks, no rubs, no murmurs, irregular ABD:  Flat, positive bowel sounds normal in frequency in pitch, no bruits, no rebound, no guarding, no midline pulsatile mass, no hepatomegaly, no splenomegaly EXT:  2 plus pulses throughout, no edema, no cyanosis no clubbing   ASSESSMENT AND PLAN  ATRIAL FIB:  He seems to be maintaining normal sinus rhythm. His amiodarone dose was reduced when he saw Dr. Johney Frame recently but he didn't get this message. We clarified his medications and they will otherwise remain unchanged. He will get a TSH and liver enzymes in April.  CARDIOMYOPATHY:  His EF is now normal. I will be checking labs to include a basic metabolic profile in April as he is on spironolactone

## 2014-09-23 NOTE — Addendum Note (Signed)
Addended by: Neta Ehlers on: 09/23/2014 04:24 PM   Modules accepted: Orders

## 2014-09-23 NOTE — Progress Notes (Signed)
2D Echo completed. 09/23/2014 

## 2014-09-23 NOTE — Telephone Encounter (Signed)
Calling to get clarification on the instructions for the Metoprolol that was just sent in for the  [name] [name] . Thanks

## 2014-10-30 ENCOUNTER — Other Ambulatory Visit: Payer: Self-pay | Admitting: Cardiology

## 2014-10-31 ENCOUNTER — Encounter (HOSPITAL_COMMUNITY): Payer: Self-pay | Admitting: *Deleted

## 2014-11-11 ENCOUNTER — Encounter (HOSPITAL_COMMUNITY): Payer: Self-pay | Admitting: Nurse Practitioner

## 2014-11-11 ENCOUNTER — Ambulatory Visit (HOSPITAL_COMMUNITY)
Admission: RE | Admit: 2014-11-11 | Discharge: 2014-11-11 | Disposition: A | Discharge status: Home / Self Care | Payer: 59 | Source: Ambulatory Visit | Attending: Nurse Practitioner | Admitting: Nurse Practitioner

## 2014-11-11 VITALS — BP 118/78 | HR 58 | Ht 72.0 in | Wt 181.0 lb

## 2014-11-11 DIAGNOSIS — R9431 Abnormal electrocardiogram [ECG] [EKG]: Secondary | ICD-10-CM | POA: Insufficient documentation

## 2014-11-11 DIAGNOSIS — I481 Persistent atrial fibrillation: Secondary | ICD-10-CM

## 2014-11-11 DIAGNOSIS — I4891 Unspecified atrial fibrillation: Secondary | ICD-10-CM | POA: Diagnosis not present

## 2014-11-11 DIAGNOSIS — I4819 Other persistent atrial fibrillation: Secondary | ICD-10-CM

## 2014-11-11 NOTE — Patient Instructions (Signed)
Your physician has recommended you make the following change in your medication:  1)Stop Amiodarone  Make an appointment with Dr. Johney Frame for first of June.

## 2014-11-11 NOTE — Progress Notes (Signed)
Patient ID: [name] [name], male   DOB: 1960/12/23, 54 y.o.   MRN: [id number]  Date:  11/11/2014   PCP:  Thane Edu, MD  Cardiologist:  Rollene Rotunda Primary Electrophysiologist:  [name]  Chief Complaint  Patient presents with  . Atrial Fibrillation     History of Present Illness: [name] [name] is a 54 y.o. male who presents today for electrophysiology evaluation.   He has done well since his afib ablation, 10/13. Maintaining SR.  On f/u with Dr. Johney Frame in 1/16, he was instructed to cut amiodarone in half and stop on f/u with Dr. Antoine Poche, 6 weeks later. BB was also reduced by half. Noac continued. However, It was discovered by Dr. Antoine Poche that he did not reduce amiodarone and it was cut to 100 mg 3/1. Echo was repeated at that time as well with return of EF to normal.  Today, he denies symptoms of palpitations, chest pain, shortness of breath, orthopnea, PND, lower extremity edema, claudication, dizziness, presyncope, syncope, bleeding, or neurologic sequela. The patient is tolerating medications without difficulties and is otherwise without complaint today.    Past Medical History  Diagnosis Date  . GERD (gastroesophageal reflux disease)   . PAF (paroxysmal atrial fibrillation)     a. Dx 10/2012 s/p DCCV. b. Recurrence noted 03/2014, failed Tikosyn therapy.  . Adopted   . Anxiety   . Depression   . LV dysfunction     a. EF  45% during 10/2012 admission with no definite ischemia by nuc at that time. b. EF 30-35% by echo 03/2014.  Marland Kitchen Dysrhythmia 10/2012    afib   Past Surgical History  Procedure Laterality Date  . Tee without cardioversion N/A 11/02/2012    Procedure: TRANSESOPHAGEAL ECHOCARDIOGRAM (TEE);  Surgeon: Ricki Rodriguez, MD;  Location: [facility] ENDOSCOPY;  Service: Cardiovascular;  Laterality: N/A;  . Cardioversion N/A 11/02/2012    Procedure: CARDIOVERSION;  Surgeon: Ricki Rodriguez, MD;  Location: [facility] ENDOSCOPY;  Service: Cardiovascular;  Laterality:  N/A;  . Cardioversion  04-30-2014    with ERAF  . Cardioversion N/A 04/30/2014    Procedure: CARDIOVERSION;  Surgeon: Chrystie Nose, MD;  Location: [facility] ENDOSCOPY;  Service: Cardiovascular;  Laterality: N/A;  . Tee without cardioversion N/A 05/05/2014    Procedure: TRANSESOPHAGEAL ECHOCARDIOGRAM (TEE);  Surgeon: Lewayne Bunting, MD;  Location: [facility] ENDOSCOPY;  Service: Cardiovascular;  Laterality: N/A;  . Atrial fibrillation ablation N/A 05/06/2014    Procedure: ATRIAL FIBRILLATION ABLATION;  Surgeon: Gardiner Rhyme, MD;  Location: [facility] CATH LAB;  Service: Cardiovascular;  Laterality: N/A;     Current Outpatient Prescriptions  Medication Sig Dispense Refill  . Ascorbic Acid (VITAMIN C) 1000 MG tablet Take 1,000 mg by mouth daily.    . Cholecalciferol (VITAMIN D) 2000 UNITS tablet Take 4,000 Units by mouth daily.    . Cyanocobalamin (VITAMIN B-12 PO) Take 1 tablet by mouth daily with breakfast.    . ELIQUIS 5 MG TABS tablet take 1 tablet by mouth twice a day 60 tablet 5  . ibuprofen (ADVIL,MOTRIN) 200 MG tablet Take 400-800 mg by mouth daily as needed (pain).    . metoprolol succinate (TOPROL XL) 100 MG 24 hr tablet Take 1/2 tablet daily 30 tablet 6  . Omega-3 Fatty Acids (FISH OIL) 1000 MG CAPS Take 1,000 mg by mouth daily.    Marland Kitchen OVER THE COUNTER MEDICATION Take 2 tablets by mouth daily. "BLUE GREEN ALGAE"    . ramipril (ALTACE) 5 MG capsule take  1 capsule by mouth once daily 30 capsule 3  . ranitidine (ZANTAC) 150 MG tablet Take 150 mg by mouth daily as needed for heartburn.    . spironolactone (ALDACTONE) 25 MG tablet take 1/2 tablet by mouth once daily 15 tablet 5   No current facility-administered medications for this encounter.    Allergies:   Review of patient's allergies indicates no known allergies.   Social History:  The patient  reports that he has never smoked. He has never used smokeless tobacco. He reports that he drinks about 4.2 oz of alcohol per week. He reports that he uses  illicit drugs (Marijuana).   Family History:  Adopted and unaware of FH   ROS:  Please see the history of present illness.   All other systems are reviewed and negative.    PHYSICAL EXAM: VS:  BP 118/78 mmHg  Pulse 58  Ht 6' (1.829 m)  Wt 181 lb (82.101 kg)  BMI 24.54 kg/m2 , BMI Body mass index is 24.54 kg/(m^2). GEN: Well nourished, well developed, in no acute distress HEENT: normal Neck: no JVD, carotid bruits, or masses Cardiac: RRR; no murmurs, rubs, or gallops,no edema  Respiratory:  clear to auscultation bilaterally, normal work of breathing GI: soft, nontender, nondistended, + BS MS: no deformity or atrophy Skin: warm and dry,  Neuro:  Strength and sensation are intact Psych: euthymic mood, full affect  EKG:  EKG is ordered today. The ekg ordered today shows sinus bradycardia 49 bpm, LVH, Qtc 419    Recent Labs: 04/27/2014: ALT 24; Hemoglobin 14.9; Platelets 239; TSH 2.220 05/01/2014: Magnesium 2.1 05/07/2014: BUN 14; Creatinine 0.96; Potassium 4.0; Sodium 139    Lipid Panel     Component Value Date/Time   CHOL 125 11/02/2012 0440   TRIG 107 11/02/2012 0440   HDL 37* 11/02/2012 0440   CHOLHDL 3.4 11/02/2012 0440   VLDL 21 11/02/2012 0440   LDLCALC 67 11/02/2012 0440     Wt Readings from Last 3 Encounters:  09/23/14 177 lb 12.8 oz (80.65 kg)  08/18/14 178 lb 1.9 oz (80.795 kg)  06/03/14 173 lb 9.6 oz (78.744 kg)      Other studies Reviewed: Additional studies/ records that were reviewed today include: Dr Amedeo Plenty note 1/16, Dr Lindaann Slough note from 3/1   Left ventricle: The cavity size was mildly dilated. Systolic function was normal. The estimated ejection fraction was in the range of 55% to 60%. Wall motion was normal; there were no regional wall motion abnormalities. Doppler parameters are consistent with abnormal left ventricular relaxation (grade 1 diastolic dysfunction).  EKG- Sinus brady at 58 bpm, nonspecific T wave abnormality QTc  424 ms.  ASSESSMENT AND PLAN:  1.  afib Maintaining sinus rhythm post ablation Stop  amiodarone today per original plan of Dr. Johney Frame Continue eliquis but with return of EF to normal range, now has Chadsvasc score of 0. Can discuss stopping of Eliquis on return visit to Dr. Johney Frame in June.  2. Nonischemic CH Now with normal EF  Continue toprol   daily, but this maybe able to be reduced again on June f/u visit.  3. Bradycardia Stop amiodarone  and possible  additional decrease metoprolol dose 6/16.   Follow-up with Dr. Johney Frame in June.   Don Perking, NP  11/11/2014 9:41 AM

## 2014-11-12 NOTE — Addendum Note (Signed)
Encounter addended by: Wandalee Ferdinand on: 11/12/2014  9:06 AM<BR>     Documentation filed: Charges VN

## 2014-11-25 ENCOUNTER — Other Ambulatory Visit: Payer: Self-pay | Admitting: Cardiology

## 2015-01-14 ENCOUNTER — Telehealth: Payer: Self-pay | Admitting: *Deleted

## 2015-01-14 NOTE — Telephone Encounter (Signed)
called for fm hx, unable to reach pt.. 

## 2015-01-19 ENCOUNTER — Ambulatory Visit: Payer: 59 | Admitting: Internal Medicine

## 2015-01-20 ENCOUNTER — Encounter: Payer: Self-pay | Admitting: *Deleted

## 2015-01-21 ENCOUNTER — Ambulatory Visit (INDEPENDENT_AMBULATORY_CARE_PROVIDER_SITE_OTHER): Payer: 59 | Admitting: Internal Medicine

## 2015-01-21 VITALS — BP 112/74 | HR 61 | Ht 72.0 in | Wt 184.6 lb

## 2015-01-21 DIAGNOSIS — I480 Paroxysmal atrial fibrillation: Secondary | ICD-10-CM

## 2015-01-21 LAB — COMPREHENSIVE METABOLIC PANEL
ALT: 33 U/L (ref 0–53)
AST: 19 U/L (ref 0–37)
Albumin: 4 g/dL (ref 3.5–5.2)
Alkaline Phosphatase: 55 U/L (ref 39–117)
BUN: 22 mg/dL (ref 6–23)
CHLORIDE: 101 meq/L (ref 96–112)
CO2: 28 meq/L (ref 19–32)
Calcium: 9.1 mg/dL (ref 8.4–10.5)
Creat: 1 mg/dL (ref 0.50–1.35)
GLUCOSE: 105 mg/dL — AB (ref 70–99)
POTASSIUM: 4.3 meq/L (ref 3.5–5.3)
Sodium: 139 mEq/L (ref 135–145)
Total Bilirubin: 0.5 mg/dL (ref 0.2–1.2)
Total Protein: 6.8 g/dL (ref 6.0–8.3)

## 2015-01-21 LAB — TSH: TSH: 1.968 u[IU]/mL (ref 0.350–4.500)

## 2015-01-21 MED ORDER — METOPROLOL SUCCINATE ER 25 MG PO TB24: 25.0000 mg | ORAL_TABLET | Freq: Every day | ORAL | Status: DC

## 2015-01-21 MED ORDER — SILDENAFIL CITRATE 50 MG PO TABS: 50.0000 mg | ORAL_TABLET | Freq: Every day | ORAL | Status: DC | PRN

## 2015-01-21 NOTE — Progress Notes (Signed)
Electrophysiology Office Note   Date:  01/21/2015   ID:  [name] [name], [name] [name] 04-16-1961, MRN [id number]  PCP:  Pearla Dubonnet, MD  Cardiologist:  Antoine Poche Primary Electrophysiologist:  [name], MD    Chief Complaint  Patient presents with  . Persistent AFIB     History of Present Illness: [name] [name] is a 54 y.o. male who presents today for electrophysiology evaluation.    He has had no symptoms of afib.  He denies CHF symptoms.   + erectile dysfunction Today, he denies symptoms of palpitations, chest pain, shortness of breath, orthopnea, PND, lower extremity edema, claudication, dizziness, presyncope, syncope, bleeding, or neurologic sequela. The patient is tolerating medications without difficulties and is otherwise without complaint today.    Past Medical History  Diagnosis Date  . GERD (gastroesophageal reflux disease)   . PAF (paroxysmal atrial fibrillation)     a. Dx 10/2012 s/p DCCV. b. Recurrence noted 03/2014, failed Tikosyn therapy.  . Adopted   . Anxiety   . Depression   . LV dysfunction     a. EF  45% during 10/2012 admission with no definite ischemia by nuc at that time. b. EF 30-35% by echo 03/2014.  Marland Kitchen Dysrhythmia 10/2012    afib   Past Surgical History  Procedure Laterality Date  . Tee without cardioversion N/A 11/02/2012    Procedure: TRANSESOPHAGEAL ECHOCARDIOGRAM (TEE);  Surgeon: Ricki Rodriguez, MD;  Location: [facility] ENDOSCOPY;  Service: Cardiovascular;  Laterality: N/A;  . Cardioversion N/A 11/02/2012    Procedure: CARDIOVERSION;  Surgeon: Ricki Rodriguez, MD;  Location: [facility] ENDOSCOPY;  Service: Cardiovascular;  Laterality: N/A;  . Cardioversion  04-30-2014    with ERAF  . Cardioversion N/A 04/30/2014    Procedure: CARDIOVERSION;  Surgeon: Chrystie Nose, MD;  Location: [facility] ENDOSCOPY;  Service: Cardiovascular;  Laterality: N/A;  . Tee without cardioversion N/A 05/05/2014    Procedure: TRANSESOPHAGEAL ECHOCARDIOGRAM (TEE);  Surgeon: Lewayne Bunting,  MD;  Location: [facility] ENDOSCOPY;  Service: Cardiovascular;  Laterality: N/A;  . Atrial fibrillation ablation N/A 05/06/2014    Procedure: ATRIAL FIBRILLATION ABLATION;  Surgeon: Gardiner Rhyme, MD;  Location: [facility] CATH LAB;  Service: Cardiovascular;  Laterality: N/A;     Current Outpatient Prescriptions  Medication Sig Dispense Refill  . Ascorbic Acid (VITAMIN C) 1000 MG tablet Take 1,000 mg by mouth daily.    . Cholecalciferol (VITAMIN D) 2000 UNITS tablet Take 4,000 Units by mouth daily.    . Cyanocobalamin (VITAMIN B-12 PO) Take 1 tablet by mouth daily with breakfast.    . ELIQUIS 5 MG TABS tablet take 1 tablet by mouth twice a day 60 tablet 5  . ibuprofen (ADVIL,MOTRIN) 200 MG tablet Take 400-800 mg by mouth daily as needed (pain).    . metoprolol succinate (TOPROL-XL) 100 MG 24 hr tablet Take 50 mg by mouth daily. Take with or immediately following a meal.    . Omega-3 Fatty Acids (FISH OIL) 1000 MG CAPS Take 1,000 mg by mouth daily.    Marland Kitchen OVER THE COUNTER MEDICATION Take 2 tablets by mouth daily. "BLUE GREEN ALGAE"    . ramipril (ALTACE) 5 MG capsule take 1 capsule by mouth once daily 30 capsule 3  . spironolactone (ALDACTONE) 25 MG tablet take 1/2 tablet by mouth once daily 15 tablet 5   No current facility-administered medications for this visit.    Allergies:   Review of patient's allergies indicates no known allergies.   Social History:  The patient  reports  that he has never smoked. He has never used smokeless tobacco. He reports that he drinks about 4.2 oz of alcohol per week. He reports that he uses illicit drugs (Marijuana).   Family History:  Adopted and unaware of FH   ROS:  Please see the history of present illness.   All other systems are reviewed and negative.    PHYSICAL EXAM: VS:  BP 112/74 mmHg  Pulse 61  Ht 6' (1.829 m)  Wt 83.734 kg (184 lb 9.6 oz)  BMI 25.03 kg/m2 , BMI Body mass index is 25.03 kg/(m^2). GEN: Well nourished, well developed, in no acute  distress HEENT: normal Neck: no JVD, carotid bruits, or masses Cardiac: RRR; no murmurs, rubs, or gallops,no edema  Respiratory:  clear to auscultation bilaterally, normal work of breathing GI: soft, nontender, nondistended, + BS MS: no deformity or atrophy Skin: warm and dry,  Neuro:  Strength and sensation are intact Psych: euthymic mood, full affect  EKG:  EKG is ordered today. The ekg ordered today shows sinus rhythm    Recent Labs: 04/27/2014: Hemoglobin 14.9; Platelets 239 05/01/2014: Magnesium 2.1 01/20/2015: ALT 33; BUN 22; Creat 1.00; Potassium 4.3; Sodium 139; TSH 1.968    Lipid Panel     Component Value Date/Time   CHOL 125 11/02/2012 0440   TRIG 107 11/02/2012 0440   HDL 37* 11/02/2012 0440   CHOLHDL 3.4 11/02/2012 0440   VLDL 21 11/02/2012 0440   LDLCALC 67 11/02/2012 0440     Wt Readings from Last 3 Encounters:  01/21/15 83.734 kg (184 lb 9.6 oz)  11/11/14 82.101 kg (181 lb)  09/23/14 80.65 kg (177 lb 12.8 oz)      Other studies Reviewed: Additional studies/ records that were reviewed today include: echo 3/16--> EF has normalized     ASSESSMENT AND PLAN:  1.  afib Maintaining sinus rhythm post ablation off of AAD therapy chads2vasc is probably 0 (as EF has normalized) Stop eliquis Reduce toprol to 25mg  daily with plans to possibly stop this in the future  2. Nonischemic CH EF has normalized Reduce metoprolol to 25mg  daily  3. Erectile dysfunction Wean beta blocker viagra 50mg  prn  Follow-up with Dr Antoine Poche as scheduled Follow-up with me in 6 months    Current medicines are reviewed at length with the patient today.   The patient does not have concerns regarding his medicines.  The following changes were made today:  none  Labs/ tests ordered today include:  No orders of the defined types were placed in this encounter.      Randolm Idol, MD  01/21/2015 2:06 PM     [facility] [address] [place] [address] [phone] (office) [phone] (fax)

## 2015-01-21 NOTE — Patient Instructions (Signed)
Medication Instructions:  Your physician has recommended you make the following change in your medication:  1) Stop Eliquis 2) Decrease Toprol to 25 mg daily 3) Viagra 50mg  as needed   Labwork: None ordered  Testing/Procedures: None ordered  Follow-Up: Your physician wants you to follow-up in: 6 months with Dr Jacquiline Doe will receive a reminder letter in the mail two months in advance. If you don't receive a letter, please call our office to schedule the follow-up appointment.   Any Other Special Instructions Will Be Listed Below (If Applicable).

## 2015-02-16 ENCOUNTER — Ambulatory Visit: Payer: 59 | Admitting: Internal Medicine

## 2015-03-23 ENCOUNTER — Ambulatory Visit: Payer: 59 | Admitting: Cardiology

## 2015-03-25 ENCOUNTER — Other Ambulatory Visit: Payer: Self-pay | Admitting: Cardiology

## 2015-03-31 ENCOUNTER — Encounter: Payer: Self-pay | Admitting: Cardiology

## 2015-03-31 ENCOUNTER — Ambulatory Visit (INDEPENDENT_AMBULATORY_CARE_PROVIDER_SITE_OTHER): Payer: 59 | Admitting: Cardiology

## 2015-03-31 VITALS — BP 132/82 | HR 61 | Ht 72.0 in | Wt 183.0 lb

## 2015-03-31 DIAGNOSIS — I429 Cardiomyopathy, unspecified: Secondary | ICD-10-CM

## 2015-03-31 DIAGNOSIS — I4819 Other persistent atrial fibrillation: Secondary | ICD-10-CM

## 2015-03-31 DIAGNOSIS — I481 Persistent atrial fibrillation: Secondary | ICD-10-CM

## 2015-03-31 DIAGNOSIS — R42 Dizziness and giddiness: Secondary | ICD-10-CM | POA: Diagnosis not present

## 2015-03-31 NOTE — Patient Instructions (Signed)
Your physician recommends that you schedule a follow-up appointment in: June 2017

## 2015-03-31 NOTE — Progress Notes (Signed)
HPI The patient presents for follow up other reduced ejection fraction and atrial fibrillation.  He is status post ablation. He is maintaining sinus rhythm. His ejection fraction at the most recent reading was 60-65%. He saw Dr. Johney Frame and is now off of antiarrhythmics. He's off of Eliquis. Because he was having some fatigue his beta blocker is now reduced.  He has occasional "whooziness".  However, he's not having any palpitations, presyncope or syncope. He's had no shortness of breath, PND or orthopnea. He exercises routinely.   No Known Allergies  Current Outpatient Prescriptions  Medication Sig Dispense Refill  . Ascorbic Acid (VITAMIN C) 1000 MG tablet Take 1,000 mg by mouth daily.    . Cholecalciferol (VITAMIN D) 2000 UNITS tablet Take 4,000 Units by mouth daily.    . Cyanocobalamin (VITAMIN B-12 PO) Take 1 tablet by mouth daily with breakfast.    . ibuprofen (ADVIL,MOTRIN) 200 MG tablet Take 400-800 mg by mouth daily as needed (pain).    . metoprolol succinate (TOPROL-XL) 25 MG 24 hr tablet Take 1 tablet (25 mg total) by mouth daily. Take with or immediately following a meal. 90 tablet 3  . Omega-3 Fatty Acids (FISH OIL) 1000 MG CAPS Take 1,000 mg by mouth daily.    Marland Kitchen OVER THE COUNTER MEDICATION Take 2 tablets by mouth daily. "BLUE GREEN ALGAE"    . ramipril (ALTACE) 5 MG capsule take 1 capsule by mouth once daily 30 capsule 0  . sildenafil (VIAGRA) 50 MG tablet Take 1 tablet (50 mg total) by mouth daily as needed for erectile dysfunction. 10 tablet 0  . spironolactone (ALDACTONE) 25 MG tablet take 1/2 tablet by mouth once daily 15 tablet 5   No current facility-administered medications for this visit.    Past Medical History  Diagnosis Date  . GERD (gastroesophageal reflux disease)   . PAF (paroxysmal atrial fibrillation)     a. Dx 10/2012 s/p DCCV. b. Recurrence noted 03/2014, failed Tikosyn therapy.  . Adopted   . Anxiety   . Depression   . LV dysfunction     a. EF  45%  during 10/2012 admission with no definite ischemia by nuc at that time. b. EF 30-35% by echo 03/2014.  Marland Kitchen Dysrhythmia 10/2012    afib    Past Surgical History  Procedure Laterality Date  . Tee without cardioversion N/A 11/02/2012    Procedure: TRANSESOPHAGEAL ECHOCARDIOGRAM (TEE);  Surgeon: Ricki Rodriguez, MD;  Location: [facility] ENDOSCOPY;  Service: Cardiovascular;  Laterality: N/A;  . Cardioversion N/A 11/02/2012    Procedure: CARDIOVERSION;  Surgeon: Ricki Rodriguez, MD;  Location: [facility] ENDOSCOPY;  Service: Cardiovascular;  Laterality: N/A;  . Cardioversion  04-30-2014    with ERAF  . Cardioversion N/A 04/30/2014    Procedure: CARDIOVERSION;  Surgeon: Chrystie Nose, MD;  Location: [facility] ENDOSCOPY;  Service: Cardiovascular;  Laterality: N/A;  . Tee without cardioversion N/A 05/05/2014    Procedure: TRANSESOPHAGEAL ECHOCARDIOGRAM (TEE);  Surgeon: Lewayne Bunting, MD;  Location: [facility] ENDOSCOPY;  Service: Cardiovascular;  Laterality: N/A;  . Atrial fibrillation ablation N/A 05/06/2014    Procedure: ATRIAL FIBRILLATION ABLATION;  Surgeon: Gardiner Rhyme, MD;  Location: [facility] CATH LAB;  Service: Cardiovascular;  Laterality: N/A;    ROS:  As stated in the HPI and negative for all other systems.  PHYSICAL EXAM BP 132/82 mmHg  Pulse 61  Ht 6' (1.829 m)  Wt 183 lb (83.008 kg)  BMI 24.81 kg/m2 GENERAL:  Well appearing NECK:  No jugular venous distention, waveform within normal limits, carotid upstroke brisk and symmetric, no bruits, no thyromegalyy LUNGS:  Clear to auscultation bilaterally HEART:  PMI not displaced or sustained,S1 and S2 within normal limits, no S3, no S4, no clicks, no rubs, no murmurs.  ABD:  Flat, positive bowel sounds normal in frequency in pitch, no bruits, no rebound, no guarding, no midline pulsatile mass, no hepatomegaly, no splenomegaly EXT:  2 plus pulses throughout, no edema, no cyanosis no clubbing   ASSESSMENT AND PLAN  ATRIAL FIB:  He seems to be maintaining normal sinus  rhythm. No change in therapy is indicated.   CARDIOMYOPATHY:  His EF is now normal. I would like to have him continue the meds as listed given his previous reduced ejection fraction.

## 2015-04-21 ENCOUNTER — Other Ambulatory Visit: Payer: Self-pay | Admitting: Cardiology

## 2015-05-01 ENCOUNTER — Other Ambulatory Visit: Payer: Self-pay | Admitting: Cardiology

## 2015-10-30 ENCOUNTER — Other Ambulatory Visit: Payer: Self-pay | Admitting: *Deleted

## 2015-10-30 MED ORDER — SPIRONOLACTONE 25 MG PO TABS: 12.5000 mg | ORAL_TABLET | Freq: Every day | ORAL | Status: DC

## 2015-11-06 ENCOUNTER — Encounter: Payer: Self-pay | Admitting: Internal Medicine

## 2016-02-01 ENCOUNTER — Other Ambulatory Visit: Payer: Self-pay | Admitting: *Deleted

## 2016-02-01 MED ORDER — SPIRONOLACTONE 25 MG PO TABS: 12.5000 mg | ORAL_TABLET | Freq: Every day | ORAL | Status: DC

## 2016-02-18 ENCOUNTER — Encounter: Payer: Self-pay | Admitting: *Deleted

## 2016-02-18 ENCOUNTER — Telehealth: Payer: Self-pay | Admitting: *Deleted

## 2016-02-18 NOTE — Telephone Encounter (Signed)
I called the patient to get family history and status I also called his emergency contact Blair Heys this is now his ex wife, best to call his phone. No answer on his phone.

## 2016-03-08 ENCOUNTER — Encounter (INDEPENDENT_AMBULATORY_CARE_PROVIDER_SITE_OTHER): Payer: Self-pay

## 2016-03-08 ENCOUNTER — Encounter: Payer: Self-pay | Admitting: Cardiology

## 2016-03-08 ENCOUNTER — Ambulatory Visit (INDEPENDENT_AMBULATORY_CARE_PROVIDER_SITE_OTHER): Payer: BLUE CROSS/BLUE SHIELD | Admitting: Cardiology

## 2016-03-08 VITALS — BP 160/78 | HR 66 | Ht 72.0 in | Wt 184.6 lb

## 2016-03-08 DIAGNOSIS — I480 Paroxysmal atrial fibrillation: Secondary | ICD-10-CM | POA: Diagnosis not present

## 2016-03-08 MED ORDER — SPIRONOLACTONE 25 MG PO TABS: 12.5000 mg | ORAL_TABLET | Freq: Every day | ORAL | 1 refills | Status: DC

## 2016-03-08 NOTE — Patient Instructions (Signed)
Medication Instructions:  Your physician recommends that you continue on your current medications as directed. Please refer to the Current Medication list given to you today.  Labwork: Please have your primary care physician draw a Basic Metobolic Profile. And fax a copy of the results to our office.   Testing/Procedures: None ordered  Follow-Up: Your physician wants you to follow-up in: 1 YEAR WITH DR [facility]. You will receive a reminder letter in the mail two months in advance. If you don't receive a letter, please call our office to schedule the follow-up appointment.   Any Other Special Instructions Will Be Listed Below (If Applicable).     If you need a refill on your cardiac medications before your next appointment, please call your pharmacy.

## 2016-03-08 NOTE — Progress Notes (Signed)
HPI The patient presents for follow up other reduced ejection fraction and atrial fibrillation.  He is status post ablation. He is maintaining sinus rhythm. His ejection fraction at the most recent reading was 60-65%. He is back for one year follow up.  Since I last saw him he has done well. The patient denies any new symptoms such as chest discomfort, neck or arm discomfort. There has been no new shortness of breath, PND or orthopnea. There have been no reported palpitations, presyncope or syncope.  No Known Allergies  Current Outpatient Prescriptions  Medication Sig Dispense Refill  . Ascorbic Acid (VITAMIN C) 1000 MG tablet Take 1,000 mg by mouth daily.    . Cholecalciferol (VITAMIN D) 2000 UNITS tablet Take 4,000 Units by mouth daily.    . Cyanocobalamin (VITAMIN B-12 PO) Take 1 tablet by mouth daily with breakfast.    . ibuprofen (ADVIL,MOTRIN) 200 MG tablet Take 400-800 mg by mouth daily as needed (pain).    . metoprolol succinate (TOPROL-XL) 25 MG 24 hr tablet Take 1 tablet (25 mg total) by mouth daily. Take with or immediately following a meal. 90 tablet 3  . Omega-3 Fatty Acids (FISH OIL) 1000 MG CAPS Take 1,000 mg by mouth daily.    Marland Kitchen. OVER THE COUNTER MEDICATION Take 2 tablets by mouth daily. "BLUE GREEN ALGAE"    . ramipril (ALTACE) 5 MG capsule take 1 capsule by mouth once daily 30 capsule 10  . sildenafil (VIAGRA) 50 MG tablet Take 1 tablet (50 mg total) by mouth daily as needed for erectile dysfunction. 10 tablet 0  . spironolactone (ALDACTONE) 25 MG tablet Take 0.5 tablets (12.5 mg total) by mouth daily. NEED OV. 90 tablet 1   No current facility-administered medications for this visit.     Past Medical History:  Diagnosis Date  . Adopted   . Anxiety   . Depression   . Dysrhythmia 10/2012   afib  . GERD (gastroesophageal reflux disease)   . LV dysfunction    a. EF  45% during 10/2012 admission with no definite ischemia by nuc at that time. b. EF 30-35% by echo 03/2014.   Marland Kitchen. PAF (paroxysmal atrial fibrillation) (HCC)    a. Dx 10/2012 s/p DCCV. b. Recurrence noted 03/2014, failed Tikosyn therapy.    Past Surgical History:  Procedure Laterality Date  . ATRIAL FIBRILLATION ABLATION N/A 05/06/2014   Procedure: ATRIAL FIBRILLATION ABLATION;  Surgeon: Gardiner RhymeJames D Allred, MD;  Location: [facility] CATH LAB;  Service: Cardiovascular;  Laterality: N/A;  . CARDIOVERSION N/A 11/02/2012   Procedure: CARDIOVERSION;  Surgeon: Ricki RodriguezAjay S Kadakia, MD;  Location: [facility] ENDOSCOPY;  Service: Cardiovascular;  Laterality: N/A;  . CARDIOVERSION  04-30-2014   with ERAF  . CARDIOVERSION N/A 04/30/2014   Procedure: CARDIOVERSION;  Surgeon: Chrystie NoseKenneth C. Hilty, MD;  [facility];  Service: Cardiovascular;  Laterality: N/A;  . TEE WITHOUT CARDIOVERSION N/A 11/02/2012   Procedure: TRANSESOPHAGEAL ECHOCARDIOGRAM (TEE);  Surgeon: Ricki RodriguezAjay S Kadakia, MD;  [facility];  Service: Cardiovascular;  Laterality: N/A;  . TEE WITHOUT CARDIOVERSION N/A 05/05/2014   Procedure: TRANSESOPHAGEAL ECHOCARDIOGRAM (TEE);  Surgeon: Lewayne BuntingBrian S Crenshaw, MD;  [facility];  Service: Cardiovascular;  Laterality: N/A;    ROS:  As stated in the HPI and negative for all other systems.  PHYSICAL EXAM BP (!) 160/78   Pulse 66   Ht 6' (1.829 m)   Wt 184 lb 9.6 oz (83.7 kg)   BMI 25.04 kg/m  GENERAL:  Well appearing  NECK:  No jugular venous distention, waveform within normal limits, carotid upstroke brisk and symmetric, no bruits, no thyromegalyy LUNGS:  Clear to auscultation bilaterally HEART:  PMI not displaced or sustained,S1 and S2 within normal limits, no S3, no S4, no clicks, no rubs, no murmurs.  ABD:  Flat, positive bowel sounds normal in frequency in pitch, no bruits, no rebound, no guarding, no midline pulsatile mass, no hepatomegaly, no splenomegaly EXT:  2 plus pulses throughout, no edema, no cyanosis no clubbing  EKG:  Sinus rhythm with premature ectopic complexes, rate 66, axis within normal  limits, intervals within normal limits, no acute ST-T wave changes.  03/08/2016  ASSESSMENT AND PLAN  ATRIAL FIB:  He seems to be maintaining normal sinus rhythm. No change in therapy is indicated.   CARDIOMYOPATHY:  His EF is normal. I would like to have him continue the meds as listed given his previous reduced ejection fraction.  I have asked him to get a BMET at his primary care office.

## 2016-03-09 ENCOUNTER — Other Ambulatory Visit: Payer: Self-pay | Admitting: Cardiology

## 2016-03-09 NOTE — Telephone Encounter (Signed)
Medication Detail    Disp Refills Start End   spironolactone (ALDACTONE) 25 MG tablet 90 tablet 1 03/08/2016    Sig - Route: Take 0.5 tablets (12.5 mg total) by mouth daily. NEED OV. - Oral   E-Prescribing Status: Receipt confirmed by pharmacy (03/08/2016 3:45 PM EDT)   Pharmacy   [id number] - [place], [place] - [address]

## 2016-03-10 ENCOUNTER — Encounter: Payer: Self-pay | Admitting: Cardiology

## 2016-03-16 ENCOUNTER — Other Ambulatory Visit: Payer: Self-pay | Admitting: Internal Medicine

## 2016-03-24 ENCOUNTER — Other Ambulatory Visit: Payer: Self-pay | Admitting: Cardiology

## 2017-02-02 NOTE — Progress Notes (Signed)
HPI The patient presents for follow up other reduced ejection fraction and atrial fibrillation.  He is status post ablation. He is maintaining sinus rhythm. His ejection fraction at the most recent reading was 60-65%.   He has been doing well since I last saw him. The patient denies any new symptoms such neck or arm discomfort. There has been no new shortness of breath, PND or orthopnea. There have been no reported palpitations, presyncope or syncope.  He does get heartburn but can associate this with certain foods.  He rides his bike and walks without any symptoms.   No Known Allergies  Current Outpatient Prescriptions  Medication Sig Dispense Refill  . Ascorbic Acid (VITAMIN C) 1000 MG tablet Take 1,000 mg by mouth daily.    . Cholecalciferol (VITAMIN D) 2000 UNITS tablet Take 4,000 Units by mouth daily.    . Cyanocobalamin (VITAMIN B-12 PO) Take 1 tablet by mouth daily with breakfast.    . ibuprofen (ADVIL,MOTRIN) 200 MG tablet Take 400-800 mg by mouth daily as needed (pain).    . metoprolol succinate (TOPROL-XL) 25 MG 24 hr tablet take 1 tablet by mouth once daily TAKE WITH OR IMMEDIATELY FOLLOWING A MEAL 90 tablet 5  . Omega-3 Fatty Acids (FISH OIL) 1000 MG CAPS Take 1,000 mg by mouth daily.    Marland Kitchen OVER THE COUNTER MEDICATION Take 2 tablets by mouth daily. "BLUE GREEN ALGAE"    . ramipril (ALTACE) 5 MG capsule Take 1 capsule (5 mg total) by mouth daily. 90 capsule 5  . sildenafil (VIAGRA) 50 MG tablet Take 1 tablet (50 mg total) by mouth daily as needed for erectile dysfunction. 10 tablet 0  . spironolactone (ALDACTONE) 25 MG tablet Take 0.5 tablets (12.5 mg total) by mouth daily. 45 tablet 5   No current facility-administered medications for this visit.     Past Medical History:  Diagnosis Date  . Adopted   . Anxiety   . Depression   . Dysrhythmia 10/2012   afib  . GERD (gastroesophageal reflux disease)   . LV dysfunction    a. EF  45% during 10/2012 admission with no definite  ischemia by nuc at that time. b. EF 30-35% by echo 03/2014.  Marland Kitchen PAF (paroxysmal atrial fibrillation) (HCC)    a. Dx 10/2012 s/p DCCV. b. Recurrence noted 03/2014, failed Tikosyn therapy.    Past Surgical History:  Procedure Laterality Date  . ATRIAL FIBRILLATION ABLATION N/A 05/06/2014   Procedure: ATRIAL FIBRILLATION ABLATION;  Surgeon: Gardiner Rhyme, MD;  Location: [facility] CATH LAB;  Service: Cardiovascular;  Laterality: N/A;  . CARDIOVERSION N/A 11/02/2012   Procedure: CARDIOVERSION;  Surgeon: Ricki Rodriguez, MD;  Location: [facility] ENDOSCOPY;  Service: Cardiovascular;  Laterality: N/A;  . CARDIOVERSION  04-30-2014   with ERAF  . CARDIOVERSION N/A 04/30/2014   Procedure: CARDIOVERSION;  Surgeon: Chrystie Nose, MD;  Location: [facility] ENDOSCOPY;  Service: Cardiovascular;  Laterality: N/A;  . TEE WITHOUT CARDIOVERSION N/A 11/02/2012   Procedure: TRANSESOPHAGEAL ECHOCARDIOGRAM (TEE);  Surgeon: Ricki Rodriguez, MD;  Location: [facility] ENDOSCOPY;  Service: Cardiovascular;  Laterality: N/A;  . TEE WITHOUT CARDIOVERSION N/A 05/05/2014   Procedure: TRANSESOPHAGEAL ECHOCARDIOGRAM (TEE);  Surgeon: Lewayne Bunting, MD;  Location: [facility] ENDOSCOPY;  Service: Cardiovascular;  Laterality: N/A;    ROS:  As stated in the HPI and negative for all other systems.  PHYSICAL EXAM BP 114/70   Pulse 65   Ht 6' (1.829 m)   Wt 191 lb 9.6 oz (  86.9 kg)   BMI 25.99 kg/m   GENERAL:  Well appearing NECK:  No jugular venous distention, waveform within normal limits, carotid upstroke brisk and symmetric, no bruits, no thyromegaly LUNGS:  Clear to auscultation bilaterally CHEST:  Unremarkable HEART:  PMI not displaced or sustained,S1 and S2 within normal limits, no S3, no S4, no clicks, no rubs, no murmurs ABD:  Flat, positive bowel sounds normal in frequency in pitch, no bruits, no rebound, no guarding, no midline pulsatile mass, no hepatomegaly, no splenomegaly EXT:  2 plus pulses throughout, no edema, no cyanosis no clubbing   EKG:   Sinus rhythm with premature ectopic complexes, rate 65 , axis within normal limits, intervals within normal limits, no acute ST-T wave changes.  02/05/2017  ASSESSMENT AND PLAN  ATRIAL FIB:    He is no longer having this.  No change in therapy is indicated.  He seems to be maintaining normal sinus rhythm. No change in therapy is indicated.   CARDIOMYOPATHY:  His EF was normal at the last evaluation.  No echo is indicated this year.   PVCs:  He is not bothered by thiese.  No change in therapy is indicated.

## 2017-02-03 ENCOUNTER — Ambulatory Visit (INDEPENDENT_AMBULATORY_CARE_PROVIDER_SITE_OTHER): Payer: BLUE CROSS/BLUE SHIELD | Admitting: Cardiology

## 2017-02-03 ENCOUNTER — Encounter: Payer: Self-pay | Admitting: Cardiology

## 2017-02-03 VITALS — BP 114/70 | HR 65 | Ht 72.0 in | Wt 191.6 lb

## 2017-02-03 DIAGNOSIS — I481 Persistent atrial fibrillation: Secondary | ICD-10-CM | POA: Diagnosis not present

## 2017-02-03 DIAGNOSIS — I493 Ventricular premature depolarization: Secondary | ICD-10-CM | POA: Diagnosis not present

## 2017-02-03 DIAGNOSIS — I420 Dilated cardiomyopathy: Secondary | ICD-10-CM

## 2017-02-03 DIAGNOSIS — I4819 Other persistent atrial fibrillation: Secondary | ICD-10-CM

## 2017-02-03 MED ORDER — SPIRONOLACTONE 25 MG PO TABS: 12.5000 mg | ORAL_TABLET | Freq: Every day | ORAL | 5 refills | Status: DC

## 2017-02-03 MED ORDER — METOPROLOL SUCCINATE ER 25 MG PO TB24: ORAL_TABLET | ORAL | 5 refills | Status: DC

## 2017-02-03 MED ORDER — RAMIPRIL 5 MG PO CAPS: 5.0000 mg | ORAL_CAPSULE | Freq: Every day | ORAL | 5 refills | Status: DC

## 2017-02-03 NOTE — Patient Instructions (Signed)
Medication Instructions:  Continue current medications  Labwork: None Ordered  Testing/Procedures: None Ordered  Follow-Up: Your physician wants you to follow-up in: 18 Months. You will receive a reminder letter in the mail two months in advance. If you don't receive a letter, please call our office to schedule the follow-up appointment.   Any Other Special Instructions Will Be Listed Below (If Applicable).   If you need a refill on your cardiac medications before your next appointment, please call your pharmacy.

## 2017-02-05 ENCOUNTER — Encounter: Payer: Self-pay | Admitting: Cardiology

## 2017-02-05 DIAGNOSIS — I493 Ventricular premature depolarization: Secondary | ICD-10-CM | POA: Insufficient documentation

## 2018-02-14 ENCOUNTER — Other Ambulatory Visit: Payer: Self-pay | Admitting: Cardiology

## 2018-02-14 NOTE — Telephone Encounter (Signed)
Rx request sent to pharmacy.  

## 2018-02-28 ENCOUNTER — Other Ambulatory Visit: Payer: Self-pay | Admitting: Cardiology

## 2018-03-01 NOTE — Telephone Encounter (Signed)
Rx sent to pharmacy   

## 2018-03-09 ENCOUNTER — Other Ambulatory Visit: Payer: Self-pay | Admitting: Cardiology

## 2018-05-25 ENCOUNTER — Other Ambulatory Visit: Payer: Self-pay | Admitting: Cardiology

## 2018-06-07 ENCOUNTER — Other Ambulatory Visit: Payer: Self-pay | Admitting: Cardiology

## 2018-06-26 ENCOUNTER — Other Ambulatory Visit: Payer: Self-pay | Admitting: Cardiology

## 2018-07-04 ENCOUNTER — Encounter: Payer: Self-pay | Admitting: Cardiology

## 2018-07-28 NOTE — Progress Notes (Signed)
HPI The patient presents for follow up other reduced ejection fraction and atrial fibrillation.  He is status post ablation.  His EF which was low but was back to normal at the last visit.  He returns for follow up.  He is done quite well.  He does walking for exercise.  He does not do as much in the winter as he should though he has a stationary bike. The patient denies any new symptoms such as chest discomfort, neck or arm discomfort. There has been no new shortness of breath, PND or orthopnea. There have been no reported palpitations, presyncope or syncope.    No Known Allergies  Current Outpatient Medications  Medication Sig Dispense Refill  . Ascorbic Acid (VITAMIN C) 1000 MG tablet Take 1,000 mg by mouth daily.    . Cholecalciferol (VITAMIN D) 2000 UNITS tablet Take 4,000 Units by mouth daily.    . Cyanocobalamin (VITAMIN B-12 PO) Take 1 tablet by mouth daily with breakfast.    . ibuprofen (ADVIL,MOTRIN) 200 MG tablet Take 400-800 mg by mouth daily as needed (pain).    . metoprolol succinate (TOPROL-XL) 25 MG 24 hr tablet Take 1 tablet (25 mg total) by mouth daily. 90 tablet 3  . Omega-3 Fatty Acids (FISH OIL) 1000 MG CAPS Take 1,000 mg by mouth daily.    Marland Kitchen OVER THE COUNTER MEDICATION Take 2 tablets by mouth daily. "BLUE GREEN ALGAE"    . ramipril (ALTACE) 5 MG capsule Take 1 capsule (5 mg total) by mouth daily. 90 capsule 3  . sildenafil (VIAGRA) 50 MG tablet Take 1 tablet (50 mg total) by mouth daily as needed for erectile dysfunction. 10 tablet 1  . spironolactone (ALDACTONE) 25 MG tablet TAKE 1/2 TABLET BY MOUTH ONCE DAILY 45 tablet 5   No current facility-administered medications for this visit.     Past Medical History:  Diagnosis Date  . Adopted   . Anxiety   . Depression   . Dysrhythmia 10/2012   afib  . GERD (gastroesophageal reflux disease)   . LV dysfunction    a. EF  45% during 10/2012 admission with no definite ischemia by nuc at that time. b. EF 30-35% by echo  03/2014.  Marland Kitchen PAF (paroxysmal atrial fibrillation) (HCC)    a. Dx 10/2012 s/p DCCV. b. Recurrence noted 03/2014, failed Tikosyn therapy.    Past Surgical History:  Procedure Laterality Date  . ATRIAL FIBRILLATION ABLATION N/A 05/06/2014   Procedure: ATRIAL FIBRILLATION ABLATION;  Surgeon: Gardiner Rhyme, MD;  Location: [facility] CATH LAB;  Service: Cardiovascular;  Laterality: N/A;  . CARDIOVERSION N/A 11/02/2012   Procedure: CARDIOVERSION;  Surgeon: Ricki Rodriguez, MD;  Location: [facility] ENDOSCOPY;  Service: Cardiovascular;  Laterality: N/A;  . CARDIOVERSION  04-30-2014   with ERAF  . CARDIOVERSION N/A 04/30/2014   Procedure: CARDIOVERSION;  Surgeon: Chrystie Nose, MD;  Location: [facility] ENDOSCOPY;  Service: Cardiovascular;  Laterality: N/A;  . TEE WITHOUT CARDIOVERSION N/A 11/02/2012   Procedure: TRANSESOPHAGEAL ECHOCARDIOGRAM (TEE);  Surgeon: Ricki Rodriguez, MD;  Location: [facility] ENDOSCOPY;  Service: Cardiovascular;  Laterality: N/A;  . TEE WITHOUT CARDIOVERSION N/A 05/05/2014   Procedure: TRANSESOPHAGEAL ECHOCARDIOGRAM (TEE);  Surgeon: Lewayne Bunting, MD;  Location: [facility] ENDOSCOPY;  Service: Cardiovascular;  Laterality: N/A;    ROS:  ED.  Otherwise as stated in the HPI and negative for all other systems.  PHYSICAL EXAM BP (!) 158/102   Pulse 64   Ht 6' (1.829 m)   [employer]  193 lb 6.4 oz (87.7 kg)   BMI 26.23 kg/m   GENERAL:  Well appearing NECK:  No jugular venous distention, waveform within normal limits, carotid upstroke brisk and symmetric, no bruits, no thyromegaly LUNGS:  Clear to auscultation bilaterally CHEST:  Unremarkable HEART:  PMI not displaced or sustained,S1 and S2 within normal limits, no S3, no S4, no clicks, no rubs, no murmurs ABD:  Flat, positive bowel sounds normal in frequency in pitch, no bruits, no rebound, no guarding, no midline pulsatile mass, no hepatomegaly, no splenomegaly EXT:  2 plus pulses throughout, no edema, no cyanosis no clubbing   EKG:  Sinus rhythm with premature  ectopic complexes, rate 64, axis within normal limits, intervals within normal limits, no acute ST-T wave changes.  07/30/2018  ASSESSMENT AND PLAN  ATRIAL FIB:   He is had no palpitations.  No change in therapy or further evaluation is indicated.  CARDIOMYOPATHY:  His EF was normal at the last evaluation.  I would not suspect this is reduced by his exam or history so no further imaging is indicated.   PVCs: He does not feel these.  No change in therapy.  HTN: The blood pressure is elevated today which is unusual.  He is going to keep a blood pressure diary with a goal 130/80 and we discussed this.

## 2018-07-30 ENCOUNTER — Ambulatory Visit: Payer: BLUE CROSS/BLUE SHIELD | Admitting: Cardiology

## 2018-07-30 ENCOUNTER — Encounter: Payer: Self-pay | Admitting: Cardiology

## 2018-07-30 VITALS — BP 158/102 | HR 64 | Ht 72.0 in | Wt 193.4 lb

## 2018-07-30 DIAGNOSIS — I10 Essential (primary) hypertension: Secondary | ICD-10-CM

## 2018-07-30 DIAGNOSIS — I420 Dilated cardiomyopathy: Secondary | ICD-10-CM

## 2018-07-30 DIAGNOSIS — I480 Paroxysmal atrial fibrillation: Secondary | ICD-10-CM

## 2018-07-30 MED ORDER — SILDENAFIL CITRATE 50 MG PO TABS: 50.0000 mg | ORAL_TABLET | Freq: Every day | ORAL | 1 refills | Status: AC | PRN

## 2018-07-30 MED ORDER — METOPROLOL SUCCINATE ER 25 MG PO TB24: 25.0000 mg | ORAL_TABLET | Freq: Every day | ORAL | 3 refills | Status: DC

## 2018-07-30 MED ORDER — RAMIPRIL 5 MG PO CAPS: 5.0000 mg | ORAL_CAPSULE | Freq: Every day | ORAL | 3 refills | Status: DC

## 2018-07-30 NOTE — Patient Instructions (Signed)
Medication Instructions:  Continue current medications  If you need a refill on your cardiac medications before your next appointment, please call your pharmacy.   Lab work: None Ordered  If you have labs (blood work) drawn today and your tests are completely normal, you will receive your results only by: Marland Kitchen MyChart Message (if you have MyChart) OR . A paper copy in the mail If you have any lab test that is abnormal or we need to change your treatment, we will call you to review the results.  Testing/Procedures: None Ordered  Follow-Up: At [facility], you and your health needs are our priority.  As part of our continuing mission to provide you with exceptional heart care, we have created designated Provider Care Teams.  These Care Teams include your primary Cardiologist (physician) and Advanced Practice Providers (APPs -  Physician Assistants and Nurse Practitioners) who all work together to provide you with the care you need, when you need it. You will need a follow up appointment in 12 months.  Please call our office 2 months in advance to schedule this appointment.  You may see Dr Antoine Poche or one of the following Advanced Practice Providers on your designated Care Team:   Theodore Demark, PA-C . Joni Reining, DNP, ANP  Any Other Special Instructions Will Be Listed Below (If Applicable).  Blood pressure goal 130/80

## 2019-03-19 ENCOUNTER — Other Ambulatory Visit: Payer: Self-pay | Admitting: Cardiology

## 2019-06-18 ENCOUNTER — Other Ambulatory Visit: Payer: Self-pay

## 2019-06-18 MED ORDER — SPIRONOLACTONE 25 MG PO TABS: 12.5000 mg | ORAL_TABLET | Freq: Every day | ORAL | 0 refills | Status: DC

## 2019-08-06 ENCOUNTER — Other Ambulatory Visit: Payer: Self-pay | Admitting: Cardiology

## 2019-08-07 DIAGNOSIS — I10 Essential (primary) hypertension: Secondary | ICD-10-CM | POA: Insufficient documentation

## 2019-08-07 DIAGNOSIS — Z7189 Other specified counseling: Secondary | ICD-10-CM | POA: Insufficient documentation

## 2019-08-07 NOTE — Progress Notes (Signed)
Cardiology Office Note   Date:  08/09/2019   ID:  [name] [name], DOB 1961/04/01, MRN [id number]  PCP:  Darrow Bussing, MD  Cardiologist:   No primary care provider on file.   No chief complaint on file.     History of Present Illness: [name] [name] is a 59 y.o. male who presents for follow up other reduced ejection fraction and atrial fibrillation.  He is status post ablation.  His EF which was low but was back to normal at the last visit.  He returns for follow up.    Since I last saw him he has done well.  He did a lot of biking in the summer.  He denies any cardiovascular symptoms. The patient denies any new symptoms such as chest discomfort, neck or arm discomfort. There has been no new shortness of breath, PND or orthopnea. There have been no reported palpitations, presyncope or syncope.   Past Medical History:  Diagnosis Date  . Adopted   . Anxiety   . Depression   . Dysrhythmia 10/2012   afib  . GERD (gastroesophageal reflux disease)   . LV dysfunction    a. EF  45% during 10/2012 admission with no definite ischemia by nuc at that time. b. EF 30-35% by echo 03/2014.  Marland Kitchen PAF (paroxysmal atrial fibrillation) (HCC)    a. Dx 10/2012 s/p DCCV. b. Recurrence noted 03/2014, failed Tikosyn therapy.    Past Surgical History:  Procedure Laterality Date  . ATRIAL FIBRILLATION ABLATION N/A 05/06/2014   Procedure: ATRIAL FIBRILLATION ABLATION;  Surgeon: Gardiner Rhyme, MD;  Location: [facility] CATH LAB;  Service: Cardiovascular;  Laterality: N/A;  . CARDIOVERSION N/A 11/02/2012   Procedure: CARDIOVERSION;  Surgeon: Ricki Rodriguez, MD;  Location: [facility] ENDOSCOPY;  Service: Cardiovascular;  Laterality: N/A;  . CARDIOVERSION  04-30-2014   with ERAF  . CARDIOVERSION N/A 04/30/2014   Procedure: CARDIOVERSION;  Surgeon: Chrystie Nose, MD;  Location: [facility] ENDOSCOPY;  Service: Cardiovascular;  Laterality: N/A;  . TEE WITHOUT CARDIOVERSION N/A 11/02/2012   Procedure: TRANSESOPHAGEAL ECHOCARDIOGRAM  (TEE);  Surgeon: Ricki Rodriguez, MD;  Location: [facility] ENDOSCOPY;  Service: Cardiovascular;  Laterality: N/A;  . TEE WITHOUT CARDIOVERSION N/A 05/05/2014   Procedure: TRANSESOPHAGEAL ECHOCARDIOGRAM (TEE);  Surgeon: Lewayne Bunting, MD;  Location: [facility] ENDOSCOPY;  Service: Cardiovascular;  Laterality: N/A;     Current Outpatient Medications  Medication Sig Dispense Refill  . Ascorbic Acid (VITAMIN C) 1000 MG tablet Take 1,000 mg by mouth daily.    . Cholecalciferol (VITAMIN D) 2000 UNITS tablet Take 4,000 Units by mouth daily.    . Cyanocobalamin (VITAMIN B-12 PO) Take 1 tablet by mouth daily with breakfast.    . ibuprofen (ADVIL,MOTRIN) 200 MG tablet Take 400-800 mg by mouth daily as needed (pain).    . metoprolol succinate (TOPROL-XL) 25 MG 24 hr tablet Take 1 tablet (25 mg total) by mouth daily. 90 tablet 1  . Omega-3 Fatty Acids (FISH OIL) 1000 MG CAPS Take 1,000 mg by mouth daily.    Marland Kitchen omeprazole (PRILOSEC) 20 MG capsule Take 20 mg by mouth every other day.     Marland Kitchen OVER THE COUNTER MEDICATION Take 2 tablets by mouth daily. "BLUE GREEN ALGAE"    . ramipril (ALTACE) 5 MG capsule Take 1 capsule (5 mg total) by mouth daily. 90 capsule 3  . sildenafil (VIAGRA) 50 MG tablet Take 1 tablet (50 mg total) by mouth daily as needed for erectile dysfunction. 10 tablet 1  No current facility-administered medications for this visit.    Allergies:   Patient has no known allergies.    ROS:  Please see the history of present illness.   Otherwise, review of systems are positive for ED.   All other systems are reviewed and negative.    PHYSICAL EXAM: VS:  BP 140/84   Pulse 62   Ht 6' (1.829 m)   Wt 198 lb 8 oz (90 kg)   BMI 26.92 kg/m  , BMI Body mass index is 26.92 kg/m. GENERAL:  Well appearing HEENT:  Pupils equal round and reactive, fundi not visualized, oral mucosa unremarkable NECK:  No jugular venous distention, waveform within normal limits, carotid upstroke brisk and symmetric, no bruits, no  thyromegaly LYMPHATICS:  No cervical, inguinal adenopathy LUNGS:  Clear to auscultation bilaterally BACK:  No CVA tenderness CHEST:  Unremarkable HEART:  PMI not displaced or sustained,S1 and S2 within normal limits, no S3, no S4, no clicks, no rubs, no murmurs ABD:  Flat, positive bowel sounds normal in frequency in pitch, no bruits, no rebound, no guarding, no midline pulsatile mass, no hepatomegaly, no splenomegaly EXT:  2 plus pulses throughout, no edema, no cyanosis no clubbing SKIN:  No rashes no nodules NEURO:  Cranial nerves II through XII grossly intact, motor grossly intact throughout PSYCH:  Cognitively intact, oriented to person place and time    EKG:  EKG is ordered today. The ekg ordered today demonstrates sinus rhythm, rate 62, axis within normal limits, intervals within normal limits, premature ventricular contractions.   Recent Labs: No results found for requested labs within last 8760 hours.    Lipid Panel    Component Value Date/Time   CHOL 125 11/02/2012 0440   TRIG 107 11/02/2012 0440   HDL 37 (L) 11/02/2012 0440   CHOLHDL 3.4 11/02/2012 0440   VLDL 21 11/02/2012 0440   LDLCALC 67 11/02/2012 0440      Wt Readings from Last 3 Encounters:  08/09/19 198 lb 8 oz (90 kg)  07/30/18 193 lb 6.4 oz (87.7 kg)  02/03/17 191 lb 9.6 oz (86.9 kg)      Other studies Reviewed: Additional studies/ records that were reviewed today include: None. Review of the above records demonstrates:  Please see elsewhere in the note.     ASSESSMENT AND PLAN:  ATRIAL FIB:   The patient had no symptomatic recurrence.   CARDIOMYOPATHY:  His EF was normal at the last evaluation.  I would not suspect this is low again.  No change in therapy.  PVCs: He does not feel these.  No further evaluation.  HTN:    His blood pressure is 120s to 903E with diastolics in the 09Q at home.  He would like to try to come off of any meds that might be contributing to ED. I am going to stop  spironolactone.  He will let me know what his blood pressure is.   COVID EDUCATION:   I answered his questions about the COVID vaccine.   Current medicines are reviewed at length with the patient today.  The patient has concerns regarding medicines.  The following changes have been made:  As above  Labs/ tests ordered today include: None No orders of the defined types were placed in this encounter.    Disposition:   FU with me in 18 months    Signed, Minus Breeding, MD  08/09/2019 10:00 AM    [place]

## 2019-08-09 ENCOUNTER — Ambulatory Visit: Payer: BLUE CROSS/BLUE SHIELD | Admitting: Cardiology

## 2019-08-09 ENCOUNTER — Other Ambulatory Visit: Payer: Self-pay

## 2019-08-09 ENCOUNTER — Encounter: Payer: Self-pay | Admitting: Cardiology

## 2019-08-09 VITALS — BP 140/84 | HR 62 | Ht 72.0 in | Wt 198.5 lb

## 2019-08-09 DIAGNOSIS — I420 Dilated cardiomyopathy: Secondary | ICD-10-CM | POA: Diagnosis not present

## 2019-08-09 DIAGNOSIS — I493 Ventricular premature depolarization: Secondary | ICD-10-CM

## 2019-08-09 DIAGNOSIS — Z7189 Other specified counseling: Secondary | ICD-10-CM

## 2019-08-09 DIAGNOSIS — I480 Paroxysmal atrial fibrillation: Secondary | ICD-10-CM

## 2019-08-09 DIAGNOSIS — I10 Essential (primary) hypertension: Secondary | ICD-10-CM | POA: Diagnosis not present

## 2019-08-09 NOTE — Patient Instructions (Addendum)
Medication Instructions:  Stop Spironolactone *If you need a refill on your cardiac medications before your next appointment, please call your pharmacy*  Lab Work: None If you have labs (blood work) drawn today and your tests are completely normal, you will receive your results only by: Marland Kitchen MyChart Message (if you have MyChart) OR . A paper copy in the mail If you have any lab test that is abnormal or we need to change your treatment, we will call you to review the results.  Testing/Procedures: None  Follow-Up: At [facility], you and your health needs are our priority.  As part of our continuing mission to provide you with exceptional heart care, we have created designated Provider Care Teams.  These Care Teams include your primary Cardiologist (physician) and Advanced Practice Providers (APPs -  Physician Assistants and Nurse Practitioners) who all work together to provide you with the care you need, when you need it.  Your next appointment:   18 month(s)  The format for your next appointment:   In Person  Provider:   Rollene Rotunda, MD   Other Instructions Thank you for enrolling in MyChart. Please follow the instructions below to securely access your online medical record. MyChart allows you to send messages to your doctor, view your test results, renew your prescriptions, schedule appointments, and more.  How Do I Sign Up? 1. In your Internet browser, go to [email or web address]. 2. Click on the New  User? link in the Sign In box.  3. Enter your MyChart Access Code exactly as it appears below. You will not need to use this code after you have completed the sign-up process. If you do not sign up before the expiration date, you must request a new code. MyChart Access Code: [email or web address] Expires: 09/23/2019  9:29 AM  4. Enter the last four digits of your Social Security Number (xxxx) and Date of Birth (mm/dd/yyyy) as indicated and click Next. You will be taken to the next  sign-up page. 5. Create a MyChart ID. This will be your MyChart login ID and cannot be changed, so think of one that is secure and easy to remember. 6. Create a MyChart password. You can change your password at any time. 7. Enter your Password Reset Question and Answer and click Next. This can be used at a later time if you forget your password.  8. Select your communication preference, and if applicable enter your e-mail address. You will receive e-mail notification when new information is available in MyChart by choosing to receive e-mail notifications and filling in your e-mail. 9. Click Sign In. You can now view your medical record.   Additional Information  Remember, MyChart is NOT to be used for urgent needs. For medical emergencies, dial 911.

## 2019-08-19 NOTE — Addendum Note (Signed)
Addended by: Evans Lance on: 08/19/2019 04:24 PM   Modules accepted: Orders

## 2019-10-01 ENCOUNTER — Telehealth: Payer: Self-pay | Admitting: Cardiology

## 2019-10-01 NOTE — Telephone Encounter (Signed)
*  STAT* If patient is at the pharmacy, call can be transferred to refill team.   1. Which medications need to be refilled? (please list name of each medication and dose if known) ramipril (ALTACE) 5 MG capsule metoprolol succinate (TOPROL-XL) 25 MG 24 hr tablet  2. Which pharmacy/location (including street and city if local pharmacy) is medication to be sent to? [facility] [phone] - [place],  - [address]  3. Do they need a 30 day or 90 day supply? 90 day supply  Patient states he has 1 week's worth of metoprolol succinate (TOPROL-XL) 25 MG 24 hr tablet medication remaining. He states the pharmacy is awaiting permission from Dr. Antoine Poche to refill medication.

## 2019-10-23 ENCOUNTER — Telehealth: Payer: Self-pay | Admitting: Cardiology

## 2019-10-23 MED ORDER — METOPROLOL SUCCINATE ER 25 MG PO TB24: 25.0000 mg | ORAL_TABLET | Freq: Every day | ORAL | 3 refills | Status: DC

## 2019-10-23 MED ORDER — RAMIPRIL 5 MG PO CAPS: 5.0000 mg | ORAL_CAPSULE | Freq: Every day | ORAL | 3 refills | Status: DC

## 2019-10-23 NOTE — Telephone Encounter (Signed)
*  STAT* If patient is at the pharmacy, call can be transferred to refill team.   1. Which medications need to be refilled? (please list name of each medication and dose if known) ramipril 5 mg, metoprolol 25 mg  2. Which pharmacy/location (including street and city if local pharmacy) is medication to be sent to? [facility]. [phone]  3. Do they need a 30 day or 90 day supply? 90

## 2020-07-30 ENCOUNTER — Other Ambulatory Visit: Payer: Self-pay | Admitting: Cardiology

## 2020-08-18 ENCOUNTER — Other Ambulatory Visit: Payer: Self-pay | Admitting: Cardiology

## 2020-11-14 ENCOUNTER — Other Ambulatory Visit: Payer: Self-pay | Admitting: Cardiology

## 2020-12-27 ENCOUNTER — Other Ambulatory Visit: Payer: Self-pay | Admitting: Cardiology

## 2020-12-28 ENCOUNTER — Other Ambulatory Visit: Payer: Self-pay | Admitting: Cardiology

## 2021-02-27 NOTE — Progress Notes (Signed)
Cardiology Office Note   Date:  03/01/2021   ID:  Ovide, Dusek [birth year], MRN [id number]  PCP:  Darrow Bussing, MD  Cardiologist:   None   Chief Complaint  Patient presents with   Atrial Fibrillation   Cardiomyopathy       History of Present Illness: [name] [name] is a 60 y.o. male who presents for follow up other reduced ejection fraction and atrial fibrillation.  He is status post ablation.  His EF was low but returned to normal.  Since I last saw him he is doing very well. The patient denies any new symptoms such as chest discomfort, neck or arm discomfort. There has been no new shortness of breath, PND or orthopnea. There have been no reported palpitations, presyncope or syncope.  He will do some biking and go to the gym.  He denies any cardiovascular symptoms with this.  He has had no further palpitations.  Right Past Medical History:  Diagnosis Date   Adopted    Anxiety    Depression    Dysrhythmia 10/2012   afib   GERD (gastroesophageal reflux disease)    LV dysfunction    a. EF  45% during 10/2012 admission with no definite ischemia by nuc at that time. b. EF 30-35% by echo 03/2014.   PAF (paroxysmal atrial fibrillation) (HCC)    a. Dx 10/2012 s/p DCCV. b. Recurrence noted 03/2014, failed Tikosyn therapy.    Past Surgical History:  Procedure Laterality Date   ATRIAL FIBRILLATION ABLATION N/A 05/06/2014   Procedure: ATRIAL FIBRILLATION ABLATION;  Surgeon: Gardiner Rhyme, MD;  Location: [facility] CATH LAB;  Service: Cardiovascular;  Laterality: N/A;   CARDIOVERSION N/A 11/02/2012   Procedure: CARDIOVERSION;  Surgeon: Ricki Rodriguez, MD;  Location: [facility] ENDOSCOPY;  Service: Cardiovascular;  Laterality: N/A;   CARDIOVERSION  04-30-2014   with ERAF   CARDIOVERSION N/A 04/30/2014   Procedure: CARDIOVERSION;  Surgeon: Chrystie Nose, MD;  Location: [facility] ENDOSCOPY;  Service: Cardiovascular;  Laterality: N/A;   TEE WITHOUT CARDIOVERSION N/A 11/02/2012   Procedure:  TRANSESOPHAGEAL ECHOCARDIOGRAM (TEE);  Surgeon: Ricki Rodriguez, MD;  Location: [facility] ENDOSCOPY;  Service: Cardiovascular;  Laterality: N/A;   TEE WITHOUT CARDIOVERSION N/A 05/05/2014   Procedure: TRANSESOPHAGEAL ECHOCARDIOGRAM (TEE);  Surgeon: Lewayne Bunting, MD;  Location: [facility] ENDOSCOPY;  Service: Cardiovascular;  Laterality: N/A;     Current Outpatient Medications  Medication Sig Dispense Refill   Ascorbic Acid (VITAMIN C) 1000 MG tablet Take 1,000 mg by mouth daily.     Cholecalciferol (VITAMIN D) 2000 UNITS tablet Take 4,000 Units by mouth daily.     Cyanocobalamin (VITAMIN B-12 PO) Take 1 tablet by mouth daily with breakfast.     ibuprofen (ADVIL,MOTRIN) 200 MG tablet Take 400-800 mg by mouth daily as needed (pain).     metoprolol succinate (TOPROL-XL) 25 MG 24 hr tablet TAKE 1 TABLET(25 MG) BY MOUTH DAILY 90 tablet 1   Omega-3 Fatty Acids (FISH OIL) 1000 MG CAPS Take 1,000 mg by mouth daily.     omeprazole (PRILOSEC) 20 MG capsule Take 20 mg by mouth every other day.      ramipril (ALTACE) 5 MG capsule TAKE 1 CAPSULE(5 MG) BY MOUTH DAILY 90 capsule 0   sildenafil (VIAGRA) 50 MG tablet Take 1 tablet (50 mg total) by mouth daily as needed for erectile dysfunction. 10 tablet 1   OVER THE COUNTER MEDICATION Take 2 tablets by mouth daily. "BLUE GREEN ALGAE" (Patient not taking: Reported on  03/01/2021)     No current facility-administered medications for this visit.    Allergies:   Patient has no known allergies.    ROS:  Please see the history of present illness.   Otherwise, review of systems are positive for none.   All other systems are reviewed and negative.    PHYSICAL EXAM: VS:  BP (!) 150/82 (BP Location: Left Arm, Patient Position: Sitting, Cuff Size: Large)   Pulse (!) 57   Ht 6' (1.829 m)   Wt 195 lb (88.5 kg)   SpO2 98%   BMI 26.45 kg/m  , BMI Body mass index is 26.45 kg/m. GENERAL:  Well appearing NECK:  No jugular venous distention, waveform within normal limits,  carotid upstroke brisk and symmetric, no bruits, no thyromegaly LUNGS:  Clear to auscultation bilaterally CHEST:  Unremarkable HEART:  PMI not displaced or sustained,S1 and S2 within normal limits, no S3, no S4, no clicks, no rubs, no murmurs ABD:  Flat, positive bowel sounds normal in frequency in pitch, no bruits, no rebound, no guarding, no midline pulsatile mass, no hepatomegaly, no splenomegaly EXT:  2 plus pulses throughout, no edema, no cyanosis no clubbing   EKG:  EKG is  ordered today. The ekg ordered today demonstrates sinus rhythm, rate 57, axis within normal limits, intervals within normal limits, premature ventricular contractions.   Recent Labs: No results found for requested labs within last 8760 hours.    Lipid Panel    Component Value Date/Time   CHOL 125 11/02/2012 0440   TRIG 107 11/02/2012 0440   HDL 37 (L) 11/02/2012 0440   CHOLHDL 3.4 11/02/2012 0440   VLDL 21 11/02/2012 0440   LDLCALC 67 11/02/2012 0440      Wt Readings from Last 3 Encounters:  03/01/21 195 lb (88.5 kg)  08/09/19 198 lb 8 oz (90 kg)  07/30/18 193 lb 6.4 oz (87.7 kg)      Other studies Reviewed: Additional studies/ records that were reviewed today include: Labs Review of the above records demonstrates:  Please see elsewhere in the note.     ASSESSMENT AND PLAN:  ATRIAL FIB:   He is no longer experiencing this.  No change in therapy.   CARDIOMYOPATHY:  His EF was normal at the last evaluation.  I would not suspect that it was lower.  No change in therapy.    PVCs:    He is not feeling these.  He will continue the meds as listed.  HTN:    His blood pressure is slightly elevated today but typically well controlled at home.  He is going to keep an eye on this and let me know if it runs consistently more in the 130s or 140s I probably would go up on his Altace.    Current medicines are reviewed at length with the patient today.  The patient has concerns regarding medicines.  The  following changes have been made: None  Labs/ tests ordered today include:  None  Orders Placed This Encounter  Procedures   EKG 12-Lead      Disposition:   FU with me in 18  months    Signed, Rollene Rotunda, MD  03/01/2021 9:43 AM    [facility]

## 2021-03-01 ENCOUNTER — Other Ambulatory Visit: Payer: Self-pay

## 2021-03-01 ENCOUNTER — Ambulatory Visit: Payer: BC Managed Care – PPO | Admitting: Cardiology

## 2021-03-01 ENCOUNTER — Encounter: Payer: Self-pay | Admitting: Cardiology

## 2021-03-01 VITALS — BP 150/82 | HR 57 | Ht 72.0 in | Wt 195.0 lb

## 2021-03-01 DIAGNOSIS — I4819 Other persistent atrial fibrillation: Secondary | ICD-10-CM | POA: Diagnosis not present

## 2021-03-01 DIAGNOSIS — I10 Essential (primary) hypertension: Secondary | ICD-10-CM | POA: Diagnosis not present

## 2021-03-01 DIAGNOSIS — I420 Dilated cardiomyopathy: Secondary | ICD-10-CM | POA: Diagnosis not present

## 2021-03-01 NOTE — Patient Instructions (Signed)
Medication Instructions:  No changes *If you need a refill on your cardiac medications before your next appointment, please call your pharmacy*   Lab Work: None ordered If you have labs (blood work) drawn today and your tests are completely normal, you will receive your results only by: MyChart Message (if you have MyChart) OR A paper copy in the mail If you have any lab test that is abnormal or we need to change your treatment, we will call you to review the results.   Testing/Procedures: None ordered   Follow-Up: At [facility], you and your health needs are our priority.  As part of our continuing mission to provide you with exceptional heart care, we have created designated Provider Care Teams.  These Care Teams include your primary Cardiologist (physician) and Advanced Practice Providers (APPs -  Physician Assistants and Nurse Practitioners) who all work together to provide you with the care you need, when you need it.  We recommend signing up for the patient portal called "MyChart".  Sign up information is provided on this After Visit Summary.  MyChart is used to connect with patients for Virtual Visits (Telemedicine).  Patients are able to view lab/test results, encounter notes, upcoming appointments, etc.  Non-urgent messages can be sent to your provider as well.   To learn more about what you can do with MyChart, go to [email or web address].    Your next appointment:   18 month(s)  The format for your next appointment:   In Person  Provider:   You may see Dr. Antoine Poche or one of the following Advanced Practice Providers on your designated Care Team:   Theodore Demark, PA-C Juanda Crumble, PA-C Joni Reining, DNP, ANP

## 2021-03-04 ENCOUNTER — Ambulatory Visit: Payer: BC Managed Care – PPO | Admitting: Cardiology

## 2021-03-10 ENCOUNTER — Other Ambulatory Visit: Payer: Self-pay | Admitting: Cardiology

## 2021-03-10 NOTE — Telephone Encounter (Signed)
Rx(s) sent to pharmacy electronically.  

## 2022-03-23 ENCOUNTER — Other Ambulatory Visit: Payer: Self-pay | Admitting: Cardiology

## 2022-06-13 ENCOUNTER — Other Ambulatory Visit: Payer: Self-pay | Admitting: Cardiology

## 2022-07-16 ENCOUNTER — Other Ambulatory Visit: Payer: Self-pay | Admitting: Cardiology

## 2022-09-11 ENCOUNTER — Other Ambulatory Visit: Payer: Self-pay | Admitting: Cardiology

## 2022-10-27 NOTE — Progress Notes (Signed)
  Cardiology Office Note:   Date:  10/28/2022  ID:  [name] [name], DOB [birth year], MRN [id number]  History of Present Illness:   [name] [name] is a 62 y.o. male who presents for follow up other reduced ejection fraction and atrial fibrillation.  He is status post ablation.  His EF was low but returned to normal.  Since I last saw him he has done okay.  He had some back pain.  He had no new cardiovascular complaints.  He does not feel palpitations.  He had no presyncope or syncope.  He had no weight gain or edema.  He does ride a stationary bike couple days a week.  He might get some shortness of breath with this.  However, he does not have chest pressure, neck or arm discomfort.  He does not describe PND or orthopnea.  He does some lifting with his job.  With this he denies any cardiovascular symptoms.  ROS: As stated in the HPI and negative for all other systems.  Studies Reviewed:    EKG: Sinus rhythm, rate 56, axis within normal limits, intervals within normal limits, no acute ST-T wave changes.  Risk Assessment/Calculations:         Physical Exam:   VS:  BP (!) 160/100   Pulse (!) 56   Ht 6' (1.829 m)   Wt 200 lb (90.7 kg)   SpO2 96%   BMI 27.12 kg/m    Wt Readings from Last 3 Encounters:  10/28/22 200 lb (90.7 kg)  03/01/21 195 lb (88.5 kg)  08/09/19 198 lb 8 oz (90 kg)     GEN: Well nourished, well developed in no acute distress NECK: No JVD; No carotid bruits CARDIAC: RRR, no murmurs, rubs, gallops RESPIRATORY:  Clear to auscultation without rales, wheezing or rhonchi  ABDOMEN: Soft, non-tender, non-distended EXTREMITIES:  No edema; No deformity   ASSESSMENT AND PLAN:   ATRIAL FIB: He is no longer having atrial fibrillation.  No change in therapy.   CARDIOMYOPATHY:  His EF was normal at the last evaluation.   I would not suspect this to be different.  No change in therapy.  HTN:    His blood pressure is at target at home though elevated here.  He is going to keep a  blood pressure diary 3 times a day for a few weeks and send that to me.  He might need med titration.   Dyslipidemia:   His LDL is 128 with an HDL of 43.  He does get some shortness of breath with activity.  I am going to order a coronary calcium score for risk stratification and to determine goals of therapy.  Signed, Rollene Rotunda, MD

## 2022-10-28 ENCOUNTER — Ambulatory Visit: Payer: BC Managed Care – PPO | Attending: Cardiology | Admitting: Cardiology

## 2022-10-28 ENCOUNTER — Encounter: Payer: Self-pay | Admitting: Cardiology

## 2022-10-28 VITALS — BP 160/100 | HR 56 | Ht 72.0 in | Wt 200.0 lb

## 2022-10-28 DIAGNOSIS — I10 Essential (primary) hypertension: Secondary | ICD-10-CM

## 2022-10-28 DIAGNOSIS — I4819 Other persistent atrial fibrillation: Secondary | ICD-10-CM | POA: Diagnosis not present

## 2022-10-28 DIAGNOSIS — I420 Dilated cardiomyopathy: Secondary | ICD-10-CM

## 2022-10-28 NOTE — Patient Instructions (Signed)
Medication Instructions:  Your physician recommends that you continue on your current medications as directed. Please refer to the Current Medication list given to you today.  *If you need a refill on your cardiac medications before your next appointment, please call your pharmacy*   Testing/Procedures: Dr. Antoine Poche has ordered a CT coronary calcium score.   Test locations:  [facility]   This is $99 out of pocket.   Coronary CalciumScan A coronary calcium scan is an imaging test used to look for deposits of calcium and other fatty materials (plaques) in the inner lining of the blood vessels of the heart (coronary arteries). These deposits of calcium and plaques can partly clog and narrow the coronary arteries without producing any symptoms or warning signs. This puts a person at risk for a heart attack. This test can detect these deposits before symptoms develop. Tell a health care provider about: Any allergies you have. All medicines you are taking, including vitamins, herbs, eye drops, creams, and over-the-counter medicines. Any problems you or family members have had with anesthetic medicines. Any blood disorders you have. Any surgeries you have had. Any medical conditions you have. Whether you are pregnant or may be pregnant. What are the risks? Generally, this is a safe procedure. However, problems may occur, including: Harm to a pregnant woman and her unborn baby. This test involves the use of radiation. Radiation exposure can be dangerous to a pregnant woman and her unborn baby. If you are pregnant, you generally should not have this procedure done. Slight increase in the risk of cancer. This is because of the radiation involved in the test. What happens before the procedure? No preparation is needed for this procedure. What happens during the procedure? You will undress and remove any jewelry around your neck or chest. You will put on a  hospital gown. Sticky electrodes will be placed on your chest. The electrodes will be connected to an electrocardiogram (ECG) machine to record a tracing of the electrical activity of your heart. A CT scanner will take pictures of your heart. During this time, you will be asked to lie still and hold your breath for 2-3 seconds while a picture of your heart is being taken. The procedure may vary among health care providers and hospitals. What happens after the procedure? You can get dressed. You can return to your normal activities. It is up to you to get the results of your test. Ask your health care provider, or the department that is doing the test, when your results will be ready. Summary A coronary calcium scan is an imaging test used to look for deposits of calcium and other fatty materials (plaques) in the inner lining of the blood vessels of the heart (coronary arteries). Generally, this is a safe procedure. Tell your health care provider if you are pregnant or may be pregnant. No preparation is needed for this procedure. A CT scanner will take pictures of your heart. You can return to your normal activities after the scan is done. This information is not intended to replace advice given to you by your health care provider. Make sure you discuss any questions you have with your health care provider. Document Released: 01/07/2008 Document Revised: 05/30/2016 Document Reviewed: 05/30/2016 Elsevier Interactive Patient Education  2017 [employer].    Follow-Up: At [facility], you and your health needs are our priority.  As part of our continuing mission to provide you with exceptional heart care, we have created  designated Provider Care Teams.  These Care Teams include your primary Cardiologist (physician) and Advanced Practice Providers (APPs -  Physician Assistants and Nurse Practitioners) who all work together to provide you with the care you need, when you need it.  We  recommend signing up for the patient portal called "MyChart".  Sign up information is provided on this After Visit Summary.  MyChart is used to connect with patients for Virtual Visits (Telemedicine).  Patients are able to view lab/test results, encounter notes, upcoming appointments, etc.  Non-urgent messages can be sent to your provider as well.   To learn more about what you can do with MyChart, go to [email or web address].    Your next appointment:   2 year(s)  Provider:   Rollene Rotunda, MD    Other Instructions Please continue to keep a blood pressure log. You can send this to Dr. Antoine Poche via Mychart.

## 2022-11-15 ENCOUNTER — Encounter: Payer: Self-pay | Admitting: Cardiology

## 2022-11-17 MED ORDER — HYDROCHLOROTHIAZIDE 12.5 MG PO CAPS: 12.5000 mg | ORAL_CAPSULE | Freq: Every day | ORAL | 3 refills | Status: AC

## 2022-11-25 ENCOUNTER — Ambulatory Visit (HOSPITAL_BASED_OUTPATIENT_CLINIC_OR_DEPARTMENT_OTHER)
Admission: RE | Admit: 2022-11-25 | Discharge: 2022-11-25 | Disposition: A | Discharge status: Home / Self Care | Payer: BC Managed Care – PPO | Source: Ambulatory Visit | Attending: Cardiology | Admitting: Cardiology

## 2022-11-25 DIAGNOSIS — I10 Essential (primary) hypertension: Secondary | ICD-10-CM | POA: Insufficient documentation

## 2022-11-25 DIAGNOSIS — I420 Dilated cardiomyopathy: Secondary | ICD-10-CM | POA: Insufficient documentation

## 2022-11-25 DIAGNOSIS — I4819 Other persistent atrial fibrillation: Secondary | ICD-10-CM | POA: Insufficient documentation

## 2022-12-11 ENCOUNTER — Other Ambulatory Visit: Payer: Self-pay | Admitting: Cardiology

## 2023-02-02 ENCOUNTER — Other Ambulatory Visit: Payer: Self-pay | Admitting: Cardiology

## 2023-02-02 MED ORDER — RAMIPRIL 10 MG PO CAPS: 10.0000 mg | ORAL_CAPSULE | Freq: Every day | ORAL | 2 refills | Status: DC

## 2023-06-08 ENCOUNTER — Other Ambulatory Visit: Payer: Self-pay | Admitting: Cardiology

## 2023-08-15 ENCOUNTER — Other Ambulatory Visit: Payer: Self-pay | Admitting: Cardiology

## 2023-11-08 ENCOUNTER — Other Ambulatory Visit: Payer: Self-pay | Admitting: Cardiology
